# Patient Record
Sex: Male | Born: 1935 | Race: White | Hispanic: No | Marital: Married | Smoking: Former smoker
Health system: Southern US, Community
[De-identification: ages and names within clinical notes are randomized; demographics above are authoritative.]

## PROBLEM LIST (undated history)

## (undated) DIAGNOSIS — K4090 Unilateral inguinal hernia, without obstruction or gangrene, not specified as recurrent: Secondary | ICD-10-CM

## (undated) DIAGNOSIS — I219 Acute myocardial infarction, unspecified: Secondary | ICD-10-CM

## (undated) DIAGNOSIS — N183 Chronic kidney disease, stage 3 unspecified: Secondary | ICD-10-CM

## (undated) DIAGNOSIS — I10 Essential (primary) hypertension: Secondary | ICD-10-CM

## (undated) DIAGNOSIS — I509 Heart failure, unspecified: Secondary | ICD-10-CM

## (undated) DIAGNOSIS — E785 Hyperlipidemia, unspecified: Secondary | ICD-10-CM

## (undated) DIAGNOSIS — J189 Pneumonia, unspecified organism: Secondary | ICD-10-CM

## (undated) HISTORY — DX: Hyperlipidemia, unspecified: E78.5

## (undated) HISTORY — DX: Acute myocardial infarction, unspecified: I21.9

## (undated) HISTORY — DX: Chronic kidney disease, stage 3 unspecified: N18.30

## (undated) HISTORY — DX: Heart failure, unspecified: I50.9

## (undated) HISTORY — PX: CHOLECYSTECTOMY: SHX55

## (undated) HISTORY — PX: PROSTATE ABLATION: SHX6042

## (undated) HISTORY — DX: Chronic kidney disease, stage 3 (moderate): N18.3

## (undated) HISTORY — DX: Essential (primary) hypertension: I10

---

## 2013-02-21 HISTORY — PX: CARDIAC CATHETERIZATION: SHX172

## 2013-02-24 ENCOUNTER — Ambulatory Visit: Payer: Self-pay | Admitting: Internal Medicine

## 2013-02-24 ENCOUNTER — Inpatient Hospital Stay: Payer: Self-pay | Admitting: Internal Medicine

## 2013-02-24 DIAGNOSIS — I214 Non-ST elevation (NSTEMI) myocardial infarction: Secondary | ICD-10-CM

## 2013-02-24 LAB — CBC WITH DIFFERENTIAL/PLATELET
Basophil #: 0.1 10*3/uL (ref 0.0–0.1)
Basophil #: 0 10*3/uL (ref 0.0–0.1)
Basophil %: 0.7 %
Basophil %: 0.3 %
Eosinophil #: 0 10*3/uL (ref 0.0–0.7)
Eosinophil #: 0 10*3/uL (ref 0.0–0.7)
Eosinophil %: 0.1 %
HCT: 52.1 % — ABNORMAL HIGH (ref 40.0–52.0)
HCT: 48.5 % (ref 40.0–52.0)
HGB: 16.1 g/dL (ref 13.0–18.0)
Lymphocyte %: 18 %
MCH: 32.5 pg (ref 26.0–34.0)
MCH: 32.3 pg (ref 26.0–34.0)
MCHC: 33.2 g/dL (ref 32.0–36.0)
MCHC: 33.2 g/dL (ref 32.0–36.0)
MCV: 98 fL (ref 80–100)
MCV: 97 fL (ref 80–100)
Monocyte #: 0.6 x10 3/mm (ref 0.2–1.0)
Monocyte #: 1.1 x10 3/mm — ABNORMAL HIGH (ref 0.2–1.0)
Monocyte %: 6.4 %
Neutrophil #: 7 10*3/uL — ABNORMAL HIGH (ref 1.4–6.5)
Neutrophil #: 8.1 10*3/uL — ABNORMAL HIGH (ref 1.4–6.5)
Neutrophil %: 74.8 %
Platelet: 211 10*3/uL (ref 150–440)
Platelet: 208 10*3/uL (ref 150–440)
RBC: 5.33 10*6/uL (ref 4.40–5.90)
RDW: 13.6 % (ref 11.5–14.5)
WBC: 11.3 10*3/uL — ABNORMAL HIGH (ref 3.8–10.6)

## 2013-02-24 LAB — CK TOTAL AND CKMB (NOT AT ARMC)
CK, Total: 1140 U/L — ABNORMAL HIGH (ref 35–232)
CK, Total: 889 U/L — ABNORMAL HIGH (ref 35–232)
CK-MB: 174 ng/mL — ABNORMAL HIGH (ref 0.5–3.6)
CK-MB: 132.1 ng/mL — ABNORMAL HIGH (ref 0.5–3.6)

## 2013-02-24 LAB — URINALYSIS, COMPLETE
Bacteria: NEGATIVE
Bilirubin,UR: NEGATIVE
Glucose,UR: NEGATIVE mg/dL (ref 0–75)
Leukocyte Esterase: NEGATIVE
Nitrite: NEGATIVE
Ph: 6.5 (ref 4.5–8.0)

## 2013-02-24 LAB — BASIC METABOLIC PANEL
BUN: 15 mg/dL (ref 7–18)
Calcium, Total: 9.1 mg/dL (ref 8.5–10.1)
Chloride: 103 mmol/L (ref 98–107)
Co2: 28 mmol/L (ref 21–32)
EGFR (Non-African Amer.): 47 — ABNORMAL LOW
Osmolality: 274 (ref 275–301)
Sodium: 136 mmol/L (ref 136–145)

## 2013-02-24 LAB — COMPREHENSIVE METABOLIC PANEL
Alkaline Phosphatase: 112 U/L
Anion Gap: 10 (ref 7–16)
BUN: 14 mg/dL (ref 7–18)
Calcium, Total: 9.5 mg/dL (ref 8.5–10.1)
Chloride: 99 mmol/L (ref 98–107)
Co2: 27 mmol/L (ref 21–32)
EGFR (African American): 52 — ABNORMAL LOW
Glucose: 147 mg/dL — ABNORMAL HIGH (ref 65–99)
Osmolality: 275 (ref 275–301)
Sodium: 136 mmol/L (ref 136–145)

## 2013-02-24 LAB — TROPONIN I
Troponin-I: 27 ng/mL — ABNORMAL HIGH
Troponin-I: >40 ng/mL

## 2013-02-24 LAB — CK: CK, Total: 1092 U/L — ABNORMAL HIGH (ref 35–232)

## 2013-02-25 ENCOUNTER — Encounter: Payer: Self-pay | Admitting: Cardiovascular Disease

## 2013-02-25 DIAGNOSIS — I2510 Atherosclerotic heart disease of native coronary artery without angina pectoris: Secondary | ICD-10-CM

## 2013-02-25 DIAGNOSIS — I517 Cardiomegaly: Secondary | ICD-10-CM

## 2013-02-25 LAB — CBC WITH DIFFERENTIAL/PLATELET
Basophil #: 0 10*3/uL (ref 0.0–0.1)
Basophil %: 0.2 %
HCT: 44 % (ref 40.0–52.0)
HGB: 15.1 g/dL (ref 13.0–18.0)
Lymphocyte #: 1.9 10*3/uL (ref 1.0–3.6)
Lymphocyte %: 18.3 %
Monocyte #: 1.1 x10 3/mm — ABNORMAL HIGH (ref 0.2–1.0)
Neutrophil #: 7.2 10*3/uL — ABNORMAL HIGH (ref 1.4–6.5)
Neutrophil %: 70.9 %
RBC: 4.55 10*6/uL (ref 4.40–5.90)
RDW: 13.2 % (ref 11.5–14.5)
WBC: 10.2 10*3/uL (ref 3.8–10.6)

## 2013-02-25 LAB — CK TOTAL AND CKMB (NOT AT ARMC): CK-MB: 82.3 ng/mL — ABNORMAL HIGH (ref 0.5–3.6)

## 2013-02-25 LAB — BASIC METABOLIC PANEL
BUN: 16 mg/dL (ref 7–18)
Co2: 26 mmol/L (ref 21–32)
EGFR (African American): 55 — ABNORMAL LOW
Osmolality: 273 (ref 275–301)

## 2013-02-25 LAB — LIPID PANEL
HDL Cholesterol: 40 mg/dL (ref 40–60)
Ldl Cholesterol, Calc: 71 mg/dL (ref 0–100)
VLDL Cholesterol, Calc: 27 mg/dL (ref 5–40)

## 2013-02-25 LAB — TROPONIN I: Troponin-I: 39 ng/mL — ABNORMAL HIGH

## 2013-02-26 LAB — BASIC METABOLIC PANEL
Calcium, Total: 8.2 mg/dL — ABNORMAL LOW (ref 8.5–10.1)
Chloride: 103 mmol/L (ref 98–107)
Co2: 27 mmol/L (ref 21–32)
Creatinine: 1.46 mg/dL — ABNORMAL HIGH (ref 0.60–1.30)
EGFR (African American): 50 — ABNORMAL LOW
EGFR (Non-African Amer.): 44 — ABNORMAL LOW
Potassium: 3.7 mmol/L (ref 3.5–5.1)

## 2013-02-27 LAB — BASIC METABOLIC PANEL
Anion Gap: 7 (ref 7–16)
BUN: 28 mg/dL — ABNORMAL HIGH (ref 7–18)
Calcium, Total: 8.1 mg/dL — ABNORMAL LOW (ref 8.5–10.1)
Chloride: 102 mmol/L (ref 98–107)
Co2: 24 mmol/L (ref 21–32)
Creatinine: 1.54 mg/dL — ABNORMAL HIGH (ref 0.60–1.30)
EGFR (African American): 47 — ABNORMAL LOW
Sodium: 133 mmol/L — ABNORMAL LOW (ref 136–145)

## 2013-02-28 ENCOUNTER — Telehealth: Payer: Self-pay | Admitting: *Deleted

## 2013-02-28 ENCOUNTER — Encounter: Payer: Self-pay | Admitting: *Deleted

## 2013-02-28 NOTE — Telephone Encounter (Signed)
Patient contacted regarding discharge from [facility]  on 02/27/13  Patient understands to follow up with provider Nahser on 03/01/13 at 1345 at [facility]. Patient understands discharge instructions? Yes  Patient understands medications and regiment? Yes  Patient understands to bring all medications to this visit?yes

## 2013-03-01 ENCOUNTER — Ambulatory Visit (INDEPENDENT_AMBULATORY_CARE_PROVIDER_SITE_OTHER): Payer: Medicare Other | Admitting: Cardiovascular Disease

## 2013-03-01 ENCOUNTER — Encounter: Payer: Self-pay | Admitting: Cardiovascular Disease

## 2013-03-01 ENCOUNTER — Ambulatory Visit (INDEPENDENT_AMBULATORY_CARE_PROVIDER_SITE_OTHER): Payer: Medicare Other

## 2013-03-01 ENCOUNTER — Encounter: Payer: Self-pay | Admitting: *Deleted

## 2013-03-01 VITALS — BP 114/74 | HR 68 | Ht 72.0 in | Wt 208.0 lb

## 2013-03-01 DIAGNOSIS — N182 Chronic kidney disease, stage 2 (mild): Secondary | ICD-10-CM

## 2013-03-01 DIAGNOSIS — I2510 Atherosclerotic heart disease of native coronary artery without angina pectoris: Secondary | ICD-10-CM

## 2013-03-01 DIAGNOSIS — E785 Hyperlipidemia, unspecified: Secondary | ICD-10-CM

## 2013-03-01 DIAGNOSIS — I509 Heart failure, unspecified: Secondary | ICD-10-CM

## 2013-03-01 DIAGNOSIS — N2889 Other specified disorders of kidney and ureter: Secondary | ICD-10-CM

## 2013-03-01 DIAGNOSIS — I5022 Chronic systolic (congestive) heart failure: Secondary | ICD-10-CM

## 2013-03-01 DIAGNOSIS — I10 Essential (primary) hypertension: Secondary | ICD-10-CM

## 2013-03-01 NOTE — Assessment & Plan Note (Signed)
He's doing well from a cardiac standpoint. He's not having any episodes of angina. The decision was made to treat him medically. He has an occluded second diagonal artery with intact collateral filling.  He's done well. He's not had any episodes of angina. We'll continue with the same medications. We'll check his fasting lipids at his next office visit. We'll continue therapy for his congestive heart failure.

## 2013-03-01 NOTE — Patient Instructions (Addendum)
Your physician recommends that you schedule a follow-up appointment in: 3 months. Your physician recommends that you return for fasting lab work in: 3 months:  Liver profile & lipid profile  We will draw labs today: BMP

## 2013-03-01 NOTE — Assessment & Plan Note (Addendum)
He's not had any significant episodes of chest pain or shortness breath. He does have some rales in his right base that I think are due to atelectasis. He does not seem to have any signs or symptoms of CHF. We'll continue with his same medications. Will consider repeating an echocardiogram sometime later next year.

## 2013-03-01 NOTE — Progress Notes (Signed)
Samuel Mcbride Date of Birth  08-Aug-1925       [facility] Office [address]  [address] [place], [place]  [address]   [place], [place]  [address] [phone]     [phone]   Fax  [phone]    Fax [phone]  Problem List: 1. Coronary artery disease-status post non-ST segment elevation myocardial infarction due to occlusion of a large second diagonal branch. Medical management was recommended 2. Hyperlipidemia 3. Hypertension 4. Chronic kidney disease-stage II 5. Chronic systolic congestive heart failure-ejection fraction of 30-35% by echo.  History of Present Illness:  Samuel Mcbride is an 79 year old gentleman who presented to [facility] with episodes of chest pain. He was found to have a non-ST segment elevation myocardial infarction. He was taken to the cath lab the following day and was found to have an occlusion of the second diagonal vessel.   He had  intact collateral filling of the diagonal vessel. It was thought that he should be treated medically.    He's done well since that time. He's not had any episodes of chest pain or shortness of breath.   Echocardiogram found acute on chronic systolic congestive heart failure with an ejection fraction of 30-35%.  His diet is good. His appetite is good. He's eating low-fat and low-salt choices.    He's not had any further episodes of chest discomfort.  Current Outpatient Prescriptions on File Prior to Visit  Medication Sig Dispense Refill  . aspirin 81 MG tablet Take 81 mg by mouth daily.      . carvedilol (COREG) 6.25 MG tablet Take 6.25 mg by mouth 2 (two) times daily with a meal.      . clopidogrel (PLAVIX) 75 MG tablet Take 75 mg by mouth daily with breakfast.      . furosemide (LASIX) 20 MG tablet Take 20 mg by mouth.      . nitroGLYCERIN (NITROSTAT) 0.4 MG SL tablet Place 0.4 mg under the tongue every 5 (five) minutes as needed for chest pain.      .  simvastatin (ZOCOR) 20 MG tablet Take 20 mg by mouth daily.       No current facility-administered medications on file prior to visit.    Allergies  Allergen Reactions  . Codeine     dizziness    Past Medical History  Diagnosis Date  . MI (myocardial infarction)   . Hyperlipidemia   . Hypertension   . CHF (congestive heart failure)   . Chronic kidney disease, stage III (moderate)     Past Surgical History  Procedure Laterality Date  . Cardiac catheterization  12/14    [facility]  . Cholecystectomy      History  Smoking status  . Never Smoker   Smokeless tobacco  . Not on file    History  Alcohol Use No    Family History  Problem Relation Age of Onset  . Heart disease Father     Reviw of Systems:  Reviewed in the HPI.  All other systems are negative.  Physical Exam: Blood pressure 114/74, pulse 68, height 6' (1.829 m), weight 208 lb (94.348 kg). General: Well developed, well nourished, in no acute distress.  Head: Normocephalic, atraumatic, sclera non-icteric, mucus membranes are moist,   Neck: Supple. Carotids are 2 + without bruits. No JVD   Lungs: Clear   Heart: RR, normal S1, s2.  Abdomen: Soft, non-tender, non-distended with  normal bowel sounds.  Msk:  Strength and tone are normal   Extremities: No clubbing or cyanosis. No edema.  Distal pedal pulses are 2+ and equal   Neuro: CN II - XII intact.  Alert and oriented X 3.   Psych:  Normal   ECG: 03/01/2013: Normal sinus rhythm at 60 beats a minute. His right bundle branch block. Has no ST or T wave changes.  Assessment / Plan:

## 2013-03-07 ENCOUNTER — Encounter: Payer: Self-pay | Admitting: Cardiovascular Disease

## 2013-03-09 ENCOUNTER — Other Ambulatory Visit: Payer: Self-pay

## 2013-03-09 MED ORDER — CARVEDILOL 6.25 MG PO TABS: 6.2500 mg | ORAL_TABLET | Freq: Two times a day (BID) | ORAL | Status: DC

## 2013-03-09 NOTE — Telephone Encounter (Signed)
This encounter was created in error - please disregard.

## 2013-03-09 NOTE — Telephone Encounter (Signed)
Requested Prescriptions   Signed Prescriptions Disp Refills  . carvedilol (COREG) 6.25 MG tablet 60 tablet 3    Sig: Take 1 tablet (6.25 mg total) by mouth 2 (two) times daily with a meal.    Authorizing Provider: Vesta Mixer    Ordering User: Kendrick Fries

## 2013-03-18 ENCOUNTER — Encounter: Payer: Self-pay | Admitting: *Deleted

## 2013-03-24 DIAGNOSIS — I219 Acute myocardial infarction, unspecified: Secondary | ICD-10-CM

## 2013-03-24 HISTORY — DX: Acute myocardial infarction, unspecified: I21.9

## 2013-03-29 ENCOUNTER — Other Ambulatory Visit: Payer: Self-pay | Admitting: *Deleted

## 2013-03-29 MED ORDER — FUROSEMIDE 20 MG PO TABS: 20.0000 mg | ORAL_TABLET | Freq: Every day | ORAL | Status: DC

## 2013-03-29 MED ORDER — SIMVASTATIN 20 MG PO TABS: 20.0000 mg | ORAL_TABLET | Freq: Every day | ORAL | Status: DC

## 2013-03-29 MED ORDER — CARVEDILOL 6.25 MG PO TABS: 6.2500 mg | ORAL_TABLET | Freq: Two times a day (BID) | ORAL | Status: DC

## 2013-03-29 MED ORDER — CLOPIDOGREL BISULFATE 75 MG PO TABS: 75.0000 mg | ORAL_TABLET | Freq: Every day | ORAL | Status: DC

## 2013-03-31 ENCOUNTER — Telehealth: Payer: Self-pay

## 2013-03-31 NOTE — Telephone Encounter (Signed)
I will refill these meds

## 2013-03-31 NOTE — Telephone Encounter (Signed)
Patty from [facility] called back, informed her Dr Elease Hashimoto would refill pt medications.

## 2013-03-31 NOTE — Telephone Encounter (Signed)
Nurse with Dr Ulanda Edison has gotten several rx requests for medication when pt was in hospital. Nurse is asking if Dr. Elease Hashimoto is going to refill these or should Dr Mayford Knife. Medications are:  Plavix, Lasix and Simvastatin. Please call and advise.

## 2013-04-01 ENCOUNTER — Encounter: Payer: Self-pay | Admitting: Cardiovascular Disease

## 2013-04-01 MED ORDER — CLOPIDOGREL BISULFATE 75 MG PO TABS: 75.0000 mg | ORAL_TABLET | Freq: Every day | ORAL | Status: DC

## 2013-04-01 MED ORDER — ASPIRIN 81 MG PO TABS: 81.0000 mg | ORAL_TABLET | Freq: Every day | ORAL | Status: AC

## 2013-04-01 MED ORDER — CARVEDILOL 6.25 MG PO TABS: 6.2500 mg | ORAL_TABLET | Freq: Two times a day (BID) | ORAL | Status: DC

## 2013-04-01 MED ORDER — FUROSEMIDE 20 MG PO TABS: 20.0000 mg | ORAL_TABLET | Freq: Every day | ORAL | Status: DC

## 2013-04-01 MED ORDER — NITROGLYCERIN 0.4 MG SL SUBL: 0.4000 mg | SUBLINGUAL_TABLET | SUBLINGUAL | Status: DC | PRN

## 2013-04-01 MED ORDER — SIMVASTATIN 20 MG PO TABS: 20.0000 mg | ORAL_TABLET | Freq: Every day | ORAL | Status: DC

## 2013-04-01 NOTE — Telephone Encounter (Signed)
Refills sent to [facility].

## 2013-04-01 NOTE — Telephone Encounter (Signed)
Refill sent to [facility]

## 2013-04-01 NOTE — Addendum Note (Signed)
Addended by: Rhea Belton R on: 04/01/2013 02:39 PM   Modules accepted: Orders

## 2013-06-13 ENCOUNTER — Other Ambulatory Visit: Payer: Medicare Other

## 2013-06-13 ENCOUNTER — Encounter: Payer: Self-pay | Admitting: Cardiovascular Disease

## 2013-06-13 ENCOUNTER — Ambulatory Visit (INDEPENDENT_AMBULATORY_CARE_PROVIDER_SITE_OTHER): Payer: Medicare Other | Admitting: Cardiovascular Disease

## 2013-06-13 VITALS — BP 122/80 | HR 75 | Ht 72.0 in | Wt 202.5 lb

## 2013-06-13 DIAGNOSIS — I5022 Chronic systolic (congestive) heart failure: Secondary | ICD-10-CM

## 2013-06-13 DIAGNOSIS — I2510 Atherosclerotic heart disease of native coronary artery without angina pectoris: Secondary | ICD-10-CM

## 2013-06-13 DIAGNOSIS — I509 Heart failure, unspecified: Secondary | ICD-10-CM

## 2013-06-13 MED ORDER — LISINOPRIL 5 MG PO TABS: 5.0000 mg | ORAL_TABLET | Freq: Every day | ORAL | Status: DC

## 2013-06-13 NOTE — Assessment & Plan Note (Signed)
No angina 

## 2013-06-13 NOTE — Assessment & Plan Note (Signed)
Mr. Rocklin is doing well. He's not having any problems with shortness of breath or chest pain. He is tolerating his medications quite well. He's gained a little bit of weight but this is probably because he is eating a bit too much.  Will add lisinopril 5 mg a day. I'll see him again in 3 months for followup office visit. We will consider getting a repeat echo at that time.  We'll also get a basic profile in 3 months.

## 2013-06-13 NOTE — Patient Instructions (Signed)
Please start lisinopril 5mg  daily  Your physician recommends that you schedule a follow-up appointment in: 3 months. We will do labwork at that time: BMET

## 2013-06-13 NOTE — Progress Notes (Signed)
Samuel Mcbride Date of Birth  [birth year]       [facility] Office [address]  [address] [place], [place]  [address]   [place], [place]  [address] [phone]     [phone]   Fax  [phone]    Fax [phone]  Problem List: 1. Coronary artery disease-status post non-ST segment elevation myocardial infarction due to occlusion of a large second diagonal branch. Medical management was recommended 2. Hyperlipidemia 3. Hypertension 4. Chronic kidney disease-stage II 5. Chronic systolic congestive heart failure-ejection fraction of 30-35% by echo.  History of Present Illness:  Samuel Mcbride is an 80 year old gentleman who presented to [facility] with episodes of chest pain. He was found to have a non-ST segment elevation myocardial infarction. He was taken to the cath lab the following day and was found to have an occlusion of the second diagonal vessel.   He had  intact collateral filling of the diagonal vessel. It was thought that he should be treated medically.    He's done well since that time. He's not had any episodes of chest pain or shortness of breath.   Echocardiogram found acute on chronic systolic congestive heart failure with an ejection fraction of 30-35%.  His diet is good. His appetite is good. He's eating low-fat and low-salt choices.    He's not had any further episodes of chest discomfort.  March 23 ,2015:  Pt. Is doing well.  No dyspnea or CP.    Current Outpatient Prescriptions on File Prior to Visit  Medication Sig Dispense Refill  . aspirin 81 MG tablet Take 1 tablet (81 mg total) by mouth daily.  30 tablet  3  . carvedilol (COREG) 6.25 MG tablet Take 1 tablet (6.25 mg total) by mouth 2 (two) times daily with a meal.  60 tablet  3  . clopidogrel (PLAVIX) 75 MG tablet Take 1 tablet (75 mg total) by mouth daily with breakfast.  30 tablet  6  . furosemide (LASIX) 20 MG tablet Take 1 tablet  (20 mg total) by mouth daily.  30 tablet  6  . nitroGLYCERIN (NITROSTAT) 0.4 MG SL tablet Place 1 tablet (0.4 mg total) under the tongue every 5 (five) minutes as needed for chest pain.  25 tablet  6  . simvastatin (ZOCOR) 20 MG tablet Take 1 tablet (20 mg total) by mouth daily.  30 tablet  6   No current facility-administered medications on file prior to visit.    Allergies  Allergen Reactions  . Codeine     dizziness    Past Medical History  Diagnosis Date  . MI (myocardial infarction)   . Hyperlipidemia   . Hypertension   . CHF (congestive heart failure)   . Chronic kidney disease, stage III (moderate)     Past Surgical History  Procedure Laterality Date  . Cholecystectomy    . Cardiac catheterization  12/14    [facility]    History  Smoking status  . Never Smoker   Smokeless tobacco  . Not on file    History  Alcohol Use No    Family History  Problem Relation Age of Onset  . Heart disease Father     Reviw of Systems:  Reviewed in the HPI.  All other systems are negative.  Physical Exam: Blood pressure 122/80, pulse 75, height 6' (1.829 m), weight 202 lb 8 oz (91.853 kg). General: Well developed, well  nourished, in no acute distress.  Head: Normocephalic, atraumatic, sclera non-icteric, mucus membranes are moist,   Neck: Supple. Carotids are 2 + without bruits. No JVD   Lungs: Clear   Heart: RR, normal S1, s2.  Abdomen: Soft, non-tender, non-distended with normal bowel sounds.  Msk:  Strength and tone are normal   Extremities: No clubbing or cyanosis. No edema.  Distal pedal pulses are 2+ and equal   Neuro: CN II - XII intact.  Alert and oriented X 3.   Psych:  Normal   ECG: 03/01/2013: Normal sinus rhythm at 60 beats a minute. His right bundle branch block. Has no ST or T wave changes.  Assessment / Plan:

## 2013-07-07 ENCOUNTER — Other Ambulatory Visit: Payer: Self-pay | Admitting: Cardiovascular Disease

## 2013-09-02 ENCOUNTER — Encounter: Payer: Self-pay | Admitting: Cardiovascular Disease

## 2013-09-02 ENCOUNTER — Ambulatory Visit (INDEPENDENT_AMBULATORY_CARE_PROVIDER_SITE_OTHER): Payer: Medicare Other | Admitting: Cardiovascular Disease

## 2013-09-02 VITALS — BP 108/78 | HR 71 | Ht 72.0 in | Wt 208.0 lb

## 2013-09-02 DIAGNOSIS — I2510 Atherosclerotic heart disease of native coronary artery without angina pectoris: Secondary | ICD-10-CM

## 2013-09-02 DIAGNOSIS — I509 Heart failure, unspecified: Secondary | ICD-10-CM

## 2013-09-02 DIAGNOSIS — I5022 Chronic systolic (congestive) heart failure: Secondary | ICD-10-CM

## 2013-09-02 MED ORDER — FUROSEMIDE 40 MG PO TABS: 40.0000 mg | ORAL_TABLET | Freq: Every day | ORAL | Status: DC

## 2013-09-02 MED ORDER — LOSARTAN POTASSIUM 50 MG PO TABS: 50.0000 mg | ORAL_TABLET | Freq: Every day | ORAL | Status: DC

## 2013-09-02 NOTE — Assessment & Plan Note (Signed)
BP is well controlled / on the low side today.

## 2013-09-02 NOTE — Progress Notes (Signed)
Samuel Mcbride Date of Birth  [birth year]       [facility] Office [address]  [address] [place], [place]  [address]   [place], [place]  [address] [phone]     [phone]   Fax  [phone]    Fax [phone]  Problem List: 1. Coronary artery disease-status post non-ST segment elevation myocardial infarction due to occlusion of a large second diagonal branch. Medical management was recommended 2. Hyperlipidemia 3. Hypertension 4. Chronic kidney disease-stage II 5. Chronic systolic congestive heart failure-ejection fraction of 30-35% by echo.  History of Present Illness:  Mr. Samuel Mcbride is an 80 year old gentleman who presented to [facility] with episodes of chest pain. He was found to have a non-ST segment elevation myocardial infarction. He was taken to the cath lab the following day and was found to have an occlusion of the second diagonal vessel.   He had  intact collateral filling of the diagonal vessel. It was thought that he should be treated medically.    He's done well since that time. He's not had any episodes of chest pain or shortness of breath.   Echocardiogram found acute on chronic systolic congestive heart failure with an ejection fraction of 30-35%.  His diet is good. His appetite is good. He's eating low-fat and low-salt choices.    He's not had any further episodes of chest discomfort.  March 23 ,2015:  Pt. Is doing well.  No dyspnea or CP.    September 02, 2013:  He has had a cough - probably before the Lisinopril therapy - not worsened with the Lisinopril.  Planted his garden - turnips and tomatoes  Current Outpatient Prescriptions on File Prior to Visit  Medication Sig Dispense Refill  . aspirin 81 MG tablet Take 1 tablet (81 mg total) by mouth daily.  30 tablet  3  . carvedilol (COREG) 6.25 MG tablet Take 1 tablet (6.25 mg total) by mouth 2 (two) times daily with a meal.  60 tablet  3    . clopidogrel (PLAVIX) 75 MG tablet Take 1 tablet (75 mg total) by mouth daily with breakfast.  30 tablet  6  . furosemide (LASIX) 20 MG tablet Take 1 tablet (20 mg total) by mouth daily.  30 tablet  6  . lisinopril (PRINIVIL,ZESTRIL) 5 MG tablet Take 1 tablet (5 mg total) by mouth daily.  90 tablet  3  . Loratadine (CLARITIN) 10 MG CAPS Take 10 mg by mouth daily.      . nitroGLYCERIN (NITROSTAT) 0.4 MG SL tablet Place 1 tablet (0.4 mg total) under the tongue every 5 (five) minutes as needed for chest pain.  25 tablet  6  . simvastatin (ZOCOR) 20 MG tablet Take 1 tablet (20 mg total) by mouth daily.  30 tablet  6   No current facility-administered medications on file prior to visit.    Allergies  Allergen Reactions  . Codeine     dizziness    Past Medical History  Diagnosis Date  . MI (myocardial infarction)   . Hyperlipidemia   . Hypertension   . CHF (congestive heart failure)   . Chronic kidney disease, stage III (moderate)     Past Surgical History  Procedure Laterality Date  . Cholecystectomy    . Cardiac catheterization  12/14    [facility]    History  Smoking status  . Never Smoker   Smokeless tobacco  .  Not on file    History  Alcohol Use No    Family History  Problem Relation Age of Onset  . Heart disease Father     Reviw of Systems:  Reviewed in the HPI.  All other systems are negative.  Physical Exam: Blood pressure 108/78, pulse 71, height 6' (1.829 m), weight 208 lb (94.348 kg). General: Well developed, well nourished, in no acute distress.  Head: Normocephalic, atraumatic, sclera non-icteric, mucus membranes are moist,   Neck: Supple. Carotids are 2 + without bruits. No JVD   Lungs: rales in left base, very slight rales in right base.    Heart: RR, normal S1, s2.  Abdomen: Soft, non-tender, non-distended with normal bowel sounds.  Msk:  Strength and tone are normal   Extremities: No clubbing or cyanosis. No edema.  Distal pedal pulses are 2+  and equal   Neuro: CN II - XII intact.  Alert and oriented X 3.   Psych:  Normal   ECG: September 02, 2013:  NSR at 3371, RBBB  Assessment / Plan:

## 2013-09-02 NOTE — Patient Instructions (Signed)
Medication changes:   Stop Lisinopril                                      Increase Furosemide to 40mg  daily                                      Start:  Losartan 50mg  daily  You will need fasting lab work in 3 weeks:  Bmp, cholesterol, lfts  Your physician recommends that you schedule a follow-up appointment in: 3 months with Dr. Elease Hashimoto  Lab work was done today: bmp     We will call you with your results

## 2013-09-02 NOTE — Assessment & Plan Note (Signed)
Mr. Samuel Mcbride has a hx of CHF.   Has rales on exam today  He has a dry cough ( thinks it may have been there before the lisinopril) . Will DC lisinopril - it may be worsening the cough Start losartan 50 a day Increase lasix to 40 a day BMP today BMP in 3 weeks I'll see him in 3 months - consider echo once we have got his meds stabilized

## 2013-09-02 NOTE — Assessment & Plan Note (Signed)
No angina,  Continue current meds

## 2013-09-03 ENCOUNTER — Ambulatory Visit: Payer: Self-pay | Admitting: Emergency Medicine

## 2013-09-03 LAB — BASIC METABOLIC PANEL
BUN/Creatinine Ratio: 14 (ref 10–22)
BUN: 22 mg/dL (ref 8–27)
CALCIUM: 9.2 mg/dL (ref 8.6–10.2)
CO2: 20 mmol/L (ref 18–29)
CREATININE: 1.6 mg/dL — AB (ref 0.76–1.27)
Chloride: 101 mmol/L (ref 97–108)
GFR calc Af Amer: 45 mL/min/{1.73_m2} — ABNORMAL LOW (ref >59)
GFR calc non Af Amer: 39 mL/min/{1.73_m2} — ABNORMAL LOW (ref >59)
Glucose: 62 mg/dL — ABNORMAL LOW (ref 65–99)
Potassium: 5.4 mmol/L — ABNORMAL HIGH (ref 3.5–5.2)
Sodium: 141 mmol/L (ref 134–144)

## 2013-09-27 ENCOUNTER — Other Ambulatory Visit: Payer: Medicare Other

## 2013-10-04 ENCOUNTER — Ambulatory Visit (INDEPENDENT_AMBULATORY_CARE_PROVIDER_SITE_OTHER): Payer: Medicare Other | Admitting: *Deleted

## 2013-10-04 ENCOUNTER — Other Ambulatory Visit: Payer: Medicare Other

## 2013-10-04 DIAGNOSIS — I10 Essential (primary) hypertension: Secondary | ICD-10-CM

## 2013-10-04 DIAGNOSIS — I2510 Atherosclerotic heart disease of native coronary artery without angina pectoris: Secondary | ICD-10-CM

## 2013-10-04 DIAGNOSIS — E785 Hyperlipidemia, unspecified: Secondary | ICD-10-CM

## 2013-10-04 DIAGNOSIS — I5022 Chronic systolic (congestive) heart failure: Secondary | ICD-10-CM

## 2013-10-04 DIAGNOSIS — N182 Chronic kidney disease, stage 2 (mild): Secondary | ICD-10-CM

## 2013-10-04 DIAGNOSIS — I509 Heart failure, unspecified: Secondary | ICD-10-CM

## 2013-10-05 LAB — HEPATIC FUNCTION PANEL
ALK PHOS: 76 IU/L (ref 39–117)
ALT: 18 IU/L (ref 0–44)
AST: 17 IU/L (ref 0–40)
Albumin: 4 g/dL (ref 3.5–4.7)
Bilirubin, Direct: 0.15 mg/dL (ref 0.00–0.40)
Total Bilirubin: 0.5 mg/dL (ref 0.0–1.2)
Total Protein: 6.5 g/dL (ref 6.0–8.5)

## 2013-10-05 LAB — LIPID PANEL
CHOL/HDL RATIO: 3 ratio (ref 0.0–5.0)
Cholesterol, Total: 130 mg/dL (ref 100–199)
HDL: 44 mg/dL (ref >39)
LDL Calculated: 48 mg/dL (ref 0–99)
Triglycerides: 191 mg/dL — ABNORMAL HIGH (ref 0–149)
VLDL CHOLESTEROL CAL: 38 mg/dL (ref 5–40)

## 2013-10-05 LAB — BASIC METABOLIC PANEL
BUN/Creatinine Ratio: 15 (ref 10–22)
BUN: 23 mg/dL (ref 8–27)
CALCIUM: 9.2 mg/dL (ref 8.6–10.2)
CHLORIDE: 102 mmol/L (ref 97–108)
CO2: 23 mmol/L (ref 18–29)
Creatinine, Ser: 1.54 mg/dL — ABNORMAL HIGH (ref 0.76–1.27)
GFR calc Af Amer: 47 mL/min/{1.73_m2} — ABNORMAL LOW (ref >59)
GFR calc non Af Amer: 41 mL/min/{1.73_m2} — ABNORMAL LOW (ref >59)
GLUCOSE: 104 mg/dL — AB (ref 65–99)
Potassium: 4.5 mmol/L (ref 3.5–5.2)
Sodium: 142 mmol/L (ref 134–144)

## 2013-10-18 ENCOUNTER — Other Ambulatory Visit: Payer: Self-pay | Admitting: Cardiovascular Disease

## 2013-10-24 ENCOUNTER — Other Ambulatory Visit: Payer: Self-pay | Admitting: Cardiovascular Disease

## 2013-11-06 ENCOUNTER — Other Ambulatory Visit: Payer: Self-pay | Admitting: Cardiovascular Disease

## 2013-12-01 ENCOUNTER — Other Ambulatory Visit: Payer: Self-pay | Admitting: *Deleted

## 2013-12-01 MED ORDER — SIMVASTATIN 20 MG PO TABS: ORAL_TABLET | ORAL | Status: DC

## 2013-12-01 NOTE — Telephone Encounter (Signed)
Requested Prescriptions   Signed Prescriptions Disp Refills  . simvastatin (ZOCOR) 20 MG tablet 90 tablet 3    Sig: TAKE 1 TABLET (20 MG TOTAL) BY MOUTH DAILY.    Authorizing Provider: Vesta Mixer    Ordering User: Kendrick Fries

## 2013-12-02 ENCOUNTER — Other Ambulatory Visit: Payer: Self-pay

## 2013-12-02 MED ORDER — LOSARTAN POTASSIUM 50 MG PO TABS: 50.0000 mg | ORAL_TABLET | Freq: Every day | ORAL | Status: DC

## 2013-12-02 NOTE — Telephone Encounter (Signed)
Refill sent for losartan. 

## 2013-12-05 ENCOUNTER — Encounter: Payer: Self-pay | Admitting: Cardiovascular Disease

## 2013-12-05 ENCOUNTER — Other Ambulatory Visit: Payer: Self-pay | Admitting: *Deleted

## 2013-12-05 ENCOUNTER — Ambulatory Visit (INDEPENDENT_AMBULATORY_CARE_PROVIDER_SITE_OTHER): Payer: Medicare Other | Admitting: Cardiovascular Disease

## 2013-12-05 VITALS — BP 120/82 | HR 58 | Ht 72.0 in | Wt 208.2 lb

## 2013-12-05 DIAGNOSIS — N182 Chronic kidney disease, stage 2 (mild): Secondary | ICD-10-CM

## 2013-12-05 DIAGNOSIS — I509 Heart failure, unspecified: Secondary | ICD-10-CM

## 2013-12-05 DIAGNOSIS — I10 Essential (primary) hypertension: Secondary | ICD-10-CM

## 2013-12-05 DIAGNOSIS — I2510 Atherosclerotic heart disease of native coronary artery without angina pectoris: Secondary | ICD-10-CM

## 2013-12-05 DIAGNOSIS — I5022 Chronic systolic (congestive) heart failure: Secondary | ICD-10-CM

## 2013-12-05 MED ORDER — CARVEDILOL 6.25 MG PO TABS: ORAL_TABLET | ORAL | Status: DC

## 2013-12-05 MED ORDER — CLOPIDOGREL BISULFATE 75 MG PO TABS: ORAL_TABLET | ORAL | Status: DC

## 2013-12-05 MED ORDER — FUROSEMIDE 40 MG PO TABS: 40.0000 mg | ORAL_TABLET | Freq: Every day | ORAL | Status: DC

## 2013-12-05 NOTE — Progress Notes (Signed)
Samuel Mcbride Date of Birth  [birth year]       [facility] Office [address]  [address] [place], [place]  [address]   [place], [place]  [address] [phone]     [phone]   Fax  [phone]    Fax [phone]  Problem List: 1. Coronary artery disease-status post non-ST segment elevation myocardial infarction due to occlusion of a large second diagonal branch. Medical management was recommended 2. Hyperlipidemia 3. Hypertension 4. Chronic kidney disease-stage II 5. Chronic systolic congestive heart failure-ejection fraction of 30-35% by echo.  History of Present Illness:  Samuel Mcbride is an 80 year old gentleman who presented to [facility] with episodes of chest pain. He was found to have a non-ST segment elevation myocardial infarction. He was taken to the cath lab the following day and was found to have an occlusion of the second diagonal vessel.   He had  intact collateral filling of the diagonal vessel. It was thought that he should be treated medically.    He's done well since that time. He's not had any episodes of chest pain or shortness of breath.   Echocardiogram found acute on chronic systolic congestive heart failure with an ejection fraction of 30-35%.  His diet is good. His appetite is good. He's eating low-fat and low-salt choices.    He's not had any further episodes of chest discomfort.  March 23 ,2015:  Pt. Is doing well.  No dyspnea or CP.    September 02, 2013:  He has had a cough - probably before the Lisinopril therapy - not worsened with the Lisinopril.  Planted his garden - turnips and tomatoes  Sept. 14, 2015:  Samuel Mcbride is doing much better since we made medicine changes. We discontinued the lisinopril started him on low start. We also added Lasix. His cough has improved.  Breathing is much better.   He also has had his flu shot last month    Current Outpatient Prescriptions  on File Prior to Visit  Medication Sig Dispense Refill  . aspirin 81 MG tablet Take 1 tablet (81 mg total) by mouth daily.  30 tablet  3  . carvedilol (COREG) 6.25 MG tablet TAKE 1 TABLET (6.25 MG TOTAL) BY MOUTH 2 (TWO) TIMES DAILY WITH A MEAL.  60 tablet  3  . clopidogrel (PLAVIX) 75 MG tablet TAKE 1 TABLET (75 MG TOTAL) BY MOUTH DAILY WITH BREAKFAST.  30 tablet  6  . furosemide (LASIX) 40 MG tablet Take 1 tablet (40 mg total) by mouth daily.  30 tablet  6  . Loratadine (CLARITIN) 10 MG CAPS Take 10 mg by mouth daily.      Marland Kitchen losartan (COZAAR) 50 MG tablet Take 1 tablet (50 mg total) by mouth daily.  90 tablet  3  . nitroGLYCERIN (NITROSTAT) 0.4 MG SL tablet Place 1 tablet (0.4 mg total) under the tongue every 5 (five) minutes as needed for chest pain.  25 tablet  6  . simvastatin (ZOCOR) 20 MG tablet Take 1 tablet (20 mg total) by mouth daily.  30 tablet  6   No current facility-administered medications on file prior to visit.    Allergies  Allergen Reactions  . Codeine     dizziness    Past Medical History  Diagnosis Date  . MI (myocardial infarction)   . Hyperlipidemia   . Hypertension   . CHF (congestive heart failure)   .  Chronic kidney disease, stage III (moderate)     Past Surgical History  Procedure Laterality Date  . Cholecystectomy    . Cardiac catheterization  12/14    [facility]    History  Smoking status  . Never Smoker   Smokeless tobacco  . Not on file    History  Alcohol Use No    Family History  Problem Relation Age of Onset  . Heart disease Father     Reviw of Systems:  Reviewed in the HPI.  All other systems are negative.  Physical Exam: Blood pressure 120/82, pulse 58, height 6' (1.829 m), weight 208 lb 4 oz (94.462 kg). General: Well developed, well nourished, in no acute distress.  Head: Normocephalic, atraumatic, sclera non-icteric, mucus membranes are moist,   Neck: Supple. Carotids are 2 + without bruits. No JVD   Lungs: rales in left  base, very slight rales in right base.    Heart: RR, normal S1, s2.  Abdomen: Soft, non-tender, non-distended with normal bowel sounds.  Msk:  Strength and tone are normal   Extremities: No clubbing or cyanosis. No edema.  Distal pedal pulses are 2+ and equal   Neuro: CN II - XII intact.  Alert and oriented X 3.   Psych:  Normal   ECG:   Assessment / Plan:

## 2013-12-05 NOTE — Assessment & Plan Note (Signed)
His creatinine remains stable.

## 2013-12-05 NOTE — Patient Instructions (Signed)
Your physician wants you to follow-up in: 6 months. You will receive a reminder letter in the mail two months in advance. If you don't receive a letter, please call our office to schedule the follow-up appointment.  Your physician recommends that you continue on your current medications as directed. Please refer to the Current Medication list given to you today.  Your physician recommends that you return for lab work in:  At your 6 month appointment

## 2013-12-05 NOTE — Assessment & Plan Note (Signed)
His blood pressure is much better. Continue same medications.

## 2013-12-05 NOTE — Assessment & Plan Note (Signed)
His breathing quite a bit better. Continue with same medications. He's tolerating the low start better than he did the lisinopril. I've encouraged him to continue to avoid eating any extra salt.

## 2013-12-05 NOTE — Telephone Encounter (Signed)
Requested Prescriptions   Signed Prescriptions Disp Refills  . carvedilol (COREG) 6.25 MG tablet 180 tablet 3    Sig: TAKE 1 TABLET (6.25 MG TOTAL) BY MOUTH 2 (TWO) TIMES DAILY WITH A MEAL.    Authorizing Provider: Vesta Mixer    Ordering User: Shawnie Dapper, MARINA C  . clopidogrel (PLAVIX) 75 MG tablet 90 tablet 3    Sig: TAKE 1 TABLET (75 MG TOTAL) BY MOUTH DAILY WITH BREAKFAST.    Authorizing Provider: Vesta Mixer    Ordering User: Shawnie Dapper, MARINA C  . furosemide (LASIX) 40 MG tablet 90 tablet 3    Sig: Take 1 tablet (40 mg total) by mouth daily.    Authorizing Provider: Vesta Mixer    Ordering User: Kendrick Fries

## 2013-12-22 ENCOUNTER — Telehealth: Payer: Self-pay | Admitting: Cardiovascular Disease

## 2013-12-22 NOTE — Telephone Encounter (Signed)
Pt wife is calling about lab work, for PCP called them about their blood work they did recently and they are now referring them to [facility], for pt is stage 3 in something.

## 2013-12-26 NOTE — Telephone Encounter (Signed)
Reviewed previous BUN and creatinine with patient and wife

## 2014-01-26 ENCOUNTER — Telehealth: Payer: Self-pay

## 2014-01-26 NOTE — Telephone Encounter (Signed)
Wife called states the kidney specialist told him today to cut back on his Furosemide 40mg  to 20 mg. Please advise if he should do this. Pt wife states she will be available until 12:30 and will be gone for a couple of hours.

## 2014-01-27 NOTE — Telephone Encounter (Signed)
Informed patient wife of Dr. Namon Cirri recommendation Patients wife verbalized understanding

## 2014-01-27 NOTE — Telephone Encounter (Signed)
He has significant renal insufficiency and I agree with decreasing lasix if that is what nephrology would like to try. I do not have the most recent BMP.

## 2014-06-15 ENCOUNTER — Encounter: Payer: Self-pay | Admitting: Cardiovascular Disease

## 2014-06-15 ENCOUNTER — Ambulatory Visit (INDEPENDENT_AMBULATORY_CARE_PROVIDER_SITE_OTHER): Payer: Medicare Other | Admitting: Cardiovascular Disease

## 2014-06-15 VITALS — BP 110/82 | HR 60 | Ht 72.0 in | Wt 208.5 lb

## 2014-06-15 DIAGNOSIS — I10 Essential (primary) hypertension: Secondary | ICD-10-CM

## 2014-06-15 DIAGNOSIS — I5022 Chronic systolic (congestive) heart failure: Secondary | ICD-10-CM | POA: Diagnosis not present

## 2014-06-15 DIAGNOSIS — I2510 Atherosclerotic heart disease of native coronary artery without angina pectoris: Secondary | ICD-10-CM | POA: Diagnosis not present

## 2014-06-15 DIAGNOSIS — E785 Hyperlipidemia, unspecified: Secondary | ICD-10-CM | POA: Diagnosis not present

## 2014-06-15 NOTE — Patient Instructions (Signed)
Your physician recommends that you have labs today: BMP  Lipid and Liver    Your physician has requested that you have an echocardiogram next week. Echocardiography is a painless test that uses sound waves to create images of your heart. It provides your doctor with information about the size and shape of your heart and how well your heart's chambers and valves are working. This procedure takes approximately one hour. There are no restrictions for this procedure.   Your physician wants you to follow-up in: 6 months. You will receive a reminder letter in the mail two months in advance. If you don't receive a letter, please call our office to schedule the follow-up appointment.

## 2014-06-15 NOTE — Progress Notes (Signed)
Cardiology Office Note   Date:  06/15/2014   ID:  Samuel Mcbride, Samuel Mcbride [birth year], MRN [id number]  PCP:  [facility], [place]  Cardiologist:   Nahser, Deloris Ping, MD   Chief Complaint  Patient presents with  . other    6 month f/u no complaints. Meds reviewed verbally with pt.   1. Coronary artery disease-status post non-ST segment elevation myocardial infarction due to occlusion of a large second diagonal branch. Medical management was recommended 2. Hyperlipidemia 3. Hypertension 4. Chronic kidney disease-stage II 5. Chronic systolic congestive heart failure-ejection fraction of 30-35% by echo.  History of Present Illness:  Samuel Mcbride is an 81 year old gentleman who presented to [facility] with episodes of chest pain. He was found to have a non-ST segment elevation myocardial infarction. He was taken to the cath lab the following day and was found to have an occlusion of the second diagonal vessel. He had intact collateral filling of the diagonal vessel. It was thought that he should be treated medically.   He's done well since that time. He's not had any episodes of chest pain or shortness of breath. Echocardiogram found acute on chronic systolic congestive heart failure with an ejection fraction of 30-35%. His diet is good. His appetite is good. He's eating low-fat and low-salt choices. He's not had any further episodes of chest discomfort.  March 23 ,2015:  Pt. Is doing well. No dyspnea or CP.   September 02, 2013: He has had a cough - probably before the Lisinopril therapy - not worsened with the Lisinopril. Planted his garden - turnips and tomatoes  Sept. 14, 2015:  Samuel Mcbride is doing much better since we made medicine changes. We discontinued the lisinopril started him on low start. We also added Lasix. His cough has improved. Breathing is much better.  He also has had his flu shot last month   June 15, 2014:  Samuel Mcbride is a  81 y.o. male who presents for follow-up of his congestive heart failure. No dyspnea,  No CP. No orthopnea or    Past Medical History  Diagnosis Date  . MI (myocardial infarction)   . Hyperlipidemia   . Hypertension   . CHF (congestive heart failure)   . Chronic kidney disease, stage III (moderate)     Past Surgical History  Procedure Laterality Date  . Cholecystectomy    . Cardiac catheterization  12/14    [facility]     Current Outpatient Prescriptions  Medication Sig Dispense Refill  . aspirin 81 MG tablet Take 1 tablet (81 mg total) by mouth daily. 30 tablet 3  . carvedilol (COREG) 6.25 MG tablet TAKE 1 TABLET (6.25 MG TOTAL) BY MOUTH 2 (TWO) TIMES DAILY WITH A MEAL. 180 tablet 3  . cetirizine (ZYRTEC) 10 MG tablet Take 10 mg by mouth daily.    . clopidogrel (PLAVIX) 75 MG tablet TAKE 1 TABLET (75 MG TOTAL) BY MOUTH DAILY WITH BREAKFAST. 90 tablet 3  . fluticasone (FLONASE) 50 MCG/ACT nasal spray Place 1 spray into both nostrils as needed for allergies or rhinitis.    . furosemide (LASIX) 20 MG tablet Take 20 mg by mouth daily.    Marland Kitchen lisinopril (PRINIVIL,ZESTRIL) 5 MG tablet Take 5 mg by mouth daily.    . Multiple Vitamin (MULTIVITAMIN) tablet Take 1 tablet by mouth daily.    . nitroGLYCERIN (NITROSTAT) 0.4 MG SL tablet Place 1 tablet (0.4 mg total) under the tongue every 5 (five) minutes as needed  for chest pain. 25 tablet 6  . simvastatin (ZOCOR) 20 MG tablet Take 1 tablet (20 mg total) by mouth daily. 30 tablet 6   No current facility-administered medications for this visit.    Allergies:   Codeine    Social History:  The patient  reports that he has never smoked. He does not have any smokeless tobacco history on file. He reports that he does not drink alcohol or use illicit drugs.   Family History:  The patient's family history includes Heart disease in his father.    ROS:  Please see the history of present illness.    Review of Systems: Constitutional:  denies  fever, chills, diaphoresis, appetite change and fatigue.  HEENT: denies photophobia, eye pain, redness, hearing loss, ear pain, congestion, sore throat, rhinorrhea, sneezing, neck pain, neck stiffness and tinnitus.  Respiratory: denies SOB, DOE, cough, chest tightness, and wheezing.  Cardiovascular: denies chest pain, palpitations and leg swelling.  Gastrointestinal: denies nausea, vomiting, abdominal pain, diarrhea, constipation, blood in stool.  Genitourinary: denies dysuria, urgency, frequency, hematuria, flank pain and difficulty urinating.  Musculoskeletal: denies  myalgias, back pain, joint swelling, arthralgias and gait problem.   Skin: denies pallor, rash and wound.  Neurological: denies dizziness, seizures, syncope, weakness, light-headedness, numbness and headaches.   Hematological: denies adenopathy, easy bruising, personal or family bleeding history.  Psychiatric/ Behavioral: denies suicidal ideation, mood changes, confusion, nervousness, sleep disturbance and agitation.       All other systems are reviewed and negative.    PHYSICAL EXAM: VS:  BP 110/82 mmHg  Pulse 60  Ht 6' (1.829 m)  Wt 208 lb 8 oz (94.575 kg)  BMI 28.27 kg/m2 , BMI Body mass index is 28.27 kg/(m^2). GEN: Well nourished, well developed, in no acute distress HEENT: normal Neck: no JVD, carotid bruits, or masses Cardiac: RRR; no murmurs, rubs, or gallops,no edema  Respiratory:  clear to auscultation bilaterally, normal work of breathing GI: soft, nontender, nondistended, + BS MS: no deformity or atrophy Skin: warm and dry, no rash Neuro:  Strength and sensation are intact Psych: normal   EKG:  EKG is ordered today. The ekg ordered today demonstrates normal sinus rhythm at 60. 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   Recent Labs: 10/04/2013: ALT 18; BUN 23; Creatinine 1.54*; Potassium 4.5; Sodium 142    Lipid Panel    Component Value Date/Time   CHOL 130  10/04/2013 0758   TRIG 191* 10/04/2013 0758   HDL 44 10/04/2013 0758   CHOLHDL 3.0 10/04/2013 0758   LDLCALC 48 10/04/2013 0758      Wt Readings from Last 3 Encounters:  06/15/14 208 lb 8 oz (94.575 kg)  12/05/13 208 lb 4 oz (94.462 kg)  09/02/13 208 lb (94.348 kg)      Other studies Reviewed: Additional studies/ records that were reviewed today include: labs from [facility] Review of the above records demonstrates: normal electrolytes.    ASSESSMENT AND PLAN:  1. Coronary artery disease-status post non-ST segment elevation myocardial infarction due to occlusion of a large second diagonal branch. Medical management was recommended. He's not having any angina. We'll check his fasting lipids today.  2. Hyperlipidemia - we'll check fasting this today. Continue current medications. 3. Hypertension 4. Chronic kidney disease-stage II 5. Chronic systolic congestive heart failure-ejection fraction of 30-35% by echo. - He's doing very well. Continue current dose of carvedilol and lisinopril. We will recheck his echocardiogram next week.  If his ejection fraction is still less than 35% we will need to consider ICD placement.   He's completely asymptomatic. We'll have to also consider that he's 81 years old.   Current medicines are reviewed at length with the patient today.  The patient does not have concerns regarding medicines.  The following changes have been made:  no change  Labs/ tests ordered today include: Orders Placed This Encounter  Procedures  . EKG 12-Lead     Disposition:   FU with me in 6 months    Signed, Nahser, Deloris Ping, MD  06/15/2014 8:53 AM    [facility] [address] [place], [place], [place]  [address] Phone: [phone]; Fax: [phone]

## 2014-06-16 LAB — BASIC METABOLIC PANEL
BUN/Creatinine Ratio: 13 (ref 10–22)
BUN: 20 mg/dL (ref 8–27)
CALCIUM: 8.8 mg/dL (ref 8.6–10.2)
CO2: 23 mmol/L (ref 18–29)
CREATININE: 1.58 mg/dL — AB (ref 0.76–1.27)
Chloride: 102 mmol/L (ref 97–108)
GFR calc Af Amer: 45 mL/min/{1.73_m2} — ABNORMAL LOW (ref >59)
GFR, EST NON AFRICAN AMERICAN: 39 mL/min/{1.73_m2} — AB (ref >59)
GLUCOSE: 87 mg/dL (ref 65–99)
Potassium: 3.9 mmol/L (ref 3.5–5.2)
Sodium: 140 mmol/L (ref 134–144)

## 2014-06-16 LAB — HEPATIC FUNCTION PANEL
ALBUMIN: 3.7 g/dL (ref 3.5–4.7)
ALT: 16 IU/L (ref 0–44)
AST: 15 IU/L (ref 0–40)
Alkaline Phosphatase: 76 IU/L (ref 39–117)
BILIRUBIN TOTAL: 0.3 mg/dL (ref 0.0–1.2)
Bilirubin, Direct: 0.12 mg/dL (ref 0.00–0.40)
TOTAL PROTEIN: 5.9 g/dL — AB (ref 6.0–8.5)

## 2014-06-16 LAB — LIPID PANEL
CHOL/HDL RATIO: 2.9 ratio (ref 0.0–5.0)
Cholesterol, Total: 124 mg/dL (ref 100–199)
HDL: 43 mg/dL (ref >39)
LDL Calculated: 53 mg/dL (ref 0–99)
TRIGLYCERIDES: 142 mg/dL (ref 0–149)
VLDL Cholesterol Cal: 28 mg/dL (ref 5–40)

## 2014-07-06 ENCOUNTER — Other Ambulatory Visit: Payer: Self-pay

## 2014-07-06 ENCOUNTER — Telehealth: Payer: Self-pay | Admitting: Cardiovascular Disease

## 2014-07-06 ENCOUNTER — Other Ambulatory Visit (INDEPENDENT_AMBULATORY_CARE_PROVIDER_SITE_OTHER): Payer: Medicare Other

## 2014-07-06 DIAGNOSIS — I252 Old myocardial infarction: Secondary | ICD-10-CM

## 2014-07-06 DIAGNOSIS — I5022 Chronic systolic (congestive) heart failure: Secondary | ICD-10-CM

## 2014-07-06 NOTE — Telephone Encounter (Signed)
Patient would like to be called with echo results from today when they are available.  Patient aware that this may take several days to read and for nurse/md to contact.

## 2014-07-14 NOTE — Consult Note (Signed)
General Aspect 81 year old male who was seen in [facility] and then sent to our hospital for a direct admission with chest pain. The patient actually has a non-ST elevation MI. EKG there revealed a right bundle branch block and elevated troponin of 27. He presented to have [facility] with chest pain. The patient is not a very good historian; however, he said last night, he had chest pain. He was unable to sleep due to the chest pain, which was located on left side. It does radiate to his right jaw but not to his arm. He had nausea associated with it, but no shortness of breath. He has not had chest pain before. At Urgent Care he had a troponin which was 27.   Present Illness . PAST MEDICAL HISTORY:  None.   MEDICATIONS:  Aspirin 81 mg daily.   SOCIAL HISTORY:  No tobacco, alcohol or IV drug use.   FAMILY HISTORY:  father with cad  SURGICAL HISTORY:  None.   Physical Exam:  GEN well developed, well nourished, no acute distress   HEENT hearing intact to voice, moist oral mucosa   NECK supple   RESP normal resp effort  clear BS   CARD Regular rate and rhythm  No murmur   ABD denies tenderness  soft   LYMPH negative neck   EXTR negative edema   SKIN normal to palpation   NEURO motor/sensory function intact   PSYCH alert, A+O to time, place, person, good insight   Review of Systems:  Subjective/Chief Complaint fatigue, GI upset   General: No Complaints   Skin: No Complaints   ENT: No Complaints   Eyes: No Complaints   Neck: No Complaints   Respiratory: No Complaints   Cardiovascular: Chest pain or discomfort   Gastrointestinal: Nausea   Genitourinary: No Complaints   Vascular: No Complaints   Musculoskeletal: No Complaints   Neurologic: No Complaints   Hematologic: No Complaints   Endocrine: No Complaints   Psychiatric: No Complaints   Review of Systems: All other systems were reviewed and found to be negative    Medications/Allergies Reviewed Medications/Allergies reviewed     cholecystectomy:    BPH, s/p prostate surgery:    Other, see comments: prostrate sx  Lab Results:  Routine Chem:  04-Dec-14 15:35   Result Comment TROPONIN - RESULTS VERIFIED BY REPEAT TESTING.  - C/ JEREMIAH KEENE _0  02/24/13 BY K[facility]  - READ-BACK PROCESS PERFORMED.  Result(s) reported on 24 Feb 2013 at 04:54PM.  Glucose, Serum  128  BUN 15  Creatinine (comp)  1.38  Sodium, Serum 136  Potassium, Serum 4.0  Chloride, Serum 103  CO2, Serum 28  Calcium (Total), Serum 9.1  Anion Gap  5  Osmolality (calc) 274  eGFR (African American)  54  eGFR (Non-African American)  47 (eGFR values <80m/min/1.73 m2 may be an indication of chronic kidney disease (CKD). Calculated eGFR is useful in patients with stable renal function. The eGFR calculation will not be reliable in acutely ill patients when serum creatinine is changing rapidly. It is not useful in  patients on dialysis. The eGFR calculation may not be applicable to patients at the low and high extremes of body sizes, pregnant women, and vegetarians.)  Cardiac:  04-Dec-14 15:35   CK, Total  1140  CPK-MB, Serum  174.0 (Result(s) reported on 24 Feb 2013 at 05:24PM.)  Troponin I  > 40.00 (0.00-0.05 0.05 ng/mL or less: NEGATIVE  Repeat testing in 3-6 hrs  if  clinically indicated. >0.05 ng/mL: POTENTIAL  MYOCARDIAL INJURY. Repeat  testing in 3-6 hrs if  clinically indicated. NOTE: An increase or decrease  of 30% or more on serial  testing suggests a  clinically important change)  Routine Hem:  04-Dec-14 15:35   WBC (CBC)  11.3  RBC (CBC) 4.99  Hemoglobin (CBC) 16.1  Hematocrit (CBC) 48.5  Platelet Count (CBC) 208  MCV 97  MCH 32.3  MCHC 33.2  RDW 13.6  Neutrophil % 71.5  Lymphocyte % 17.9  Monocyte % 10.2  Eosinophil % 0.1  Basophil % 0.3  Neutrophil #  8.1  Lymphocyte # 2.0  Monocyte #  1.1  Eosinophil # 0.0  Basophil # 0.0 (Result(s) reported  on 24 Feb 2013 at 04:06PM.)   EKG:  Interpretation EKG shows nsr with rate 66 bpm, t wave abn in anterolateral leads    Codeine: Alt Ment Status  Vital Signs/Nurse's Notes: **Vital Signs.:   04-Dec-14 15:00  Temperature Temperature (F) 97.6  Celsius 36.4  Temperature Source oral  Pulse Pulse 76  Systolic BP Systolic BP 719  Diastolic BP (mmHg) Diastolic BP (mmHg) 597  Mean BP 129  Pulse Ox % Pulse Ox % 96  Pulse Ox Activity Level  At rest  Oxygen Delivery Room Air/ 21 %  Pulse Ox Heart Rate 38    Impression 81 year old male presenting with chest pain, troponin elevation,  non-ST elevation MI, gi upset.  1) nstemi EKG there reveals T wave inversion in anterolateral leads Elevated troponin of 27, now >40.   Plan is for cardiac cath tomorrow Am on lovenox, b-blocker, asa, statin will hold ace beforte cardiac cath in AM echo pending   Electronic Signatures: Ida Rogue (MD)  (Signed 04-Dec-14 19:17)  Authored: General Aspect/Present Illness, History and Physical Exam, Review of System, Past Medical History, Home Medications, Labs, EKG , Allergies, Vital Signs/Nurse's Notes, Impression/Plan   Last Updated: 04-Dec-14 19:17 by Ida Rogue (MD)

## 2014-07-14 NOTE — H&P (Signed)
PATIENT NAME:  Samuel Mcbride, Samuel Mcbride MR#:  [id number] DATE OF BIRTH:  Sep 14, 1925  DATE OF ADMISSION:  02/24/2013  PRIMARY CARE PHYSICIAN:  [facility].   CHIEF COMPLAINT:  Chest pain.   HISTORY OF PRESENT ILLNESS:  This is an 81 year old male who was seen in [facility] and then sent to our hospital for a direct admission with chest pain. The patient actually has a non-ST elevation MI. EKG there revealed a right bundle branch block and elevated troponin of 27. He presented to have [facility] with chest pain. The patient is not a very good historian; however, he said last night, he had chest pain. He was unable to sleep due to the chest pain, which was located on left side. It does radiate to his right jaw but not to his arm. He had nausea associated with it, but no shortness of breath. He has not had chest pain before. At Urgent Care he had a troponin which was 27.  REVIEW OF SYSTEMS: CONSTITUTIONAL:  No fever. Positive fatigue, weakness.  EYES:  No blurred or double vision, glaucoma or cataracts. ENT:  No ear pain, hearing loss, positive snoring. No redness of oropharynx. RESPIRATORY:  No cough, wheezing, hemoptysis, COPD. CARDIOVASCULAR:  Chest pain as mentioned above. No palpitations, orthopnea, syncope, edema, dyspnea on exertion.  GASTROINTESTINAL:  No nausea, vomiting, diarrhea, abdominal pain, melena, or ulcers.  ENDOCRINE:  No polyuria or polydipsia.  HEMATOLOGIC AND LYMPHATICS:  No anemia or easy bleeding.  SKIN:  No rash or lesions.  MUSCULOSKELETAL: No arthritis. [place]:  No history of CVA, TIAs or seizures.  PSYCHIATRIC:  No history of anxiety or depression.   PAST MEDICAL HISTORY:  None.   MEDICATIONS:  Aspirin 81 mg daily.   SOCIAL HISTORY:  No tobacco, alcohol or IV drug use.   FAMILY HISTORY:  Unknown.   SURGICAL HISTORY:  None.  PHYSICAL EXAMINATION:  VITAL SIGNS:  Temperature is 98.6, pulse 78, blood pressure 165/108, 97% on room air.  GENERAL:   Alert, oriented, not in acute distress.  HEENT:  Head is atraumatic. Pupils are round and reactive. Sclera anicteric. Mucous membranes are moist.  OROPHARYNX:  Clear.  NECK:  Supple without JVD, carotid bruit or enlarged thyroid.  CARDIOVASCULAR:  Regular rate and rhythm. No murmurs, gallops, or rubs. PMI is not displaced.  LUNGS:  Clear to auscultation without crackles, rales, rhonchi or wheezing. Normal percussion. Normal chest expansion.  ABDOMEN:  Bowel sounds are positive. Nontender and nondistended. Hard to appreciate for hepatosplenomegaly. No rebound or guarding.  EXTREMITIES:  No clubbing, cyanosis or edema.  NEUROLOGIC:  Cranial nerves II through XII are intact. There are no focal deficits.  SKIN:  Without any rash or lesions.  MUSCULOSKELETAL:  No pathology to digits or nails and all extremities move x 4.   LABORATORY, DIAGNOSTIC AND RADIOLOGICAL DATA:  White blood cells 9.3, hemoglobin 17, hematocrit 52.1, platelets are 211. Sodium 136, potassium 4.2, chloride 99, bicarb 27, BUN 14, creatinine 1.43. Glucose 147, calcium 9.5, bilirubin 0.7. Alk phos 112, ALT 41, AST 151, total protein 7.6, albumin 3.7. Urinalysis shows negative LCE and nitrites. D-dimer is 0.69. CK 1092, CPK-MB 169. Troponin 27.   Chest x-ray shows right lower lobe atelectasis versus scarring, left lower lobe infiltrate of uncertain significance.   AP abdomen:  Bowel gas pattern is normal.   ASSESSMENT AND PLAN:  This is an 81 year old male who presented with chest pain, found to have a non-ST elevation myocardial infarction at  [facility]:  1.  Non-ST elevation myocardial infarction. According to the physician I spoke with at [facility], this is not an ST elevation myocardial infarction. EKG revealed a right bundle branch block. I do not have the EKG from the [facility]. I have ordered an EKG for this hospitalization. The patient will be on Lovenox, cardiology consult, beta blocker, statin,  aspirin and low-dose lisinopril. He will need a cardiac catheterization pending creatinine. I will go ahead and obtain an echocardiogram and cardiology consultation. 2.  Acute kidney injury with a creatinine of 1.43. We will hydrate the patient and repeat a BMP in the morning.  3.  Malignant hypertension. We will treat with metoprolol, lisinopril, nitroglycerin and monitor blood pressure closely.  4.  Elevated AST secondary to non-ST elevation myocardial infarction.   CODE STATUS:  The patient is a full CODE STATUS.  CRITICAL CARE TIME SPENT:  Approximately 55 minutes   ____________________________ Rubert Frediani P. Benjie Karvonen, MD spm:jm D: 02/24/2013 15:28:03 ET T: 02/24/2013 16:47:27 ET JOB#: [id number]  cc: Briahnna Harries P. Benjie Karvonen, MD, <Dictator> [facility] P Yesmin Mutch MD ELECTRONICALLY SIGNED 02/24/2013 18:35

## 2014-07-14 NOTE — Discharge Summary (Signed)
PATIENT NAME:  Samuel Mcbride, Samuel Mcbride MR#:  [id number] DATE OF BIRTH:  05-Jun-1925  DATE OF ADMISSION:  02/24/2013  DATE OF DISCHARGE:  02/27/2013  ADMITTING DIAGNOSIS: Chest pain.  DISCHARGE DIAGNOSES:   1. Chest pain due to non-ST MI, status post cardiac catheterization, with severe one-vessel coronary artery disease, with occluded large second diagonal branch,  with faint left-to-right collaterals. Medical management was recommended by Cardiology.   2. Systolic dysfunction, with possible acute systolic congestive heart failure, treated with beta blocker and Lasix. ACE inhibitor not used due to patient's creatinine being elevated. This will be restarted as an outpatient.   3.  Malignant hypertension, now improved with current treatment.   4. Likely chronic kidney disease stage III. The patient's renal function will be followed as an outpatient. Recommend  Nephrology evaluation as outpatient as well.   5.  Hyperlipidemia.   CONSULTANTS:  Dr. Kirke Corin  PROCEDURES:  Cardiac catheterization. Cath showed one-vessel coronary artery disease, with an occluded large second diagonal branch of the ostium, faint left-to-left collaterals. Left main:   There was 30% stenosis. LAD was normal size. First diagonal was normal size. Second diagonal vessel was a large size. There was 100% stenosis. Normal circumflex.   DATA:  Admitting glucose 147, BUN 14, creatinine 1.43, sodium 136, potassium 4.2, chloride 99, CO2 is 27. Fasting lipid panel: Total cholesterol 138, triglycerides 134, HDL was 40, LDL was 71. LFTs were normal, except AST of 151. Troponin was 27, subsequently greater than 40. CK-MB was 169, then 174, then 132. WBC 9.3, hemoglobin 17.3, platelet count 211. Urine cultures: Mixed growth. EKG showed normal sinus rhythm with right bundle branch block. Echocardiogram of the heart showed EF 30% to 35%, moderate to severely decreased global left ventricular systolic function, mildly dilated left atrium, multi wall  motion abnormality, mild aortic valve sclerosis without stenosis.   HOSPITAL COURSE: Please refer to H and P done by the admitting physician. The patient is an 81 year old white male who was seen at [facility] for chest pain. He was noted to have a troponin of 27. He was admitted for non-ST MI. He was seen in consultation by Cardiology, and he was admitted to the hospital. The patient was started on a heparin drip, aspirin, beta blockers. He underwent a cardiac catheterization, which showed severe one-vessel disease. Cardiology felt that he would best benefit from medical management. He also did have some collaterals. The patient also was noted to have a significant systolic dysfunction on the echocardiogram, felt to be due to the MI. He was treated with beta blockers and ACE inhibitor during hospitalization. However, his creatinine did worsen a little. Therefore, his ACE inhibitor will be held. At this time, patient is doing much better and his breathing is stable, and he is stable for discharge.   DISCHARGE MEDICATIONS: Aspirin 81 mg 1 tab p.o. daily, nitroglycerin 0.4 sublingual p.r.n., simvastatin 20 mg at bedtime, Plavix 75 p.o. daily, carvedilol 6.25, 1 tab p.o. b.i.d., Lasix 20 daily.   INSTRUCTIONS:  Diet: Low sodium, low fat, low cholesterol. Activity as tolerated. Follow up with primary MD in 1 to 2 weeks. Follow with Dr. Kirke Corin at [facility] in 2 to 4 weeks. BMP this Tuesday in Dr. Jari Sportsman office.   Thirty-five minutes spent.     ____________________________ Lacie Scotts. Allena Katz, MD shp:mr D: 02/27/2013 11:57:01 ET T: 02/27/2013 19:56:11 ET JOB#: [id number]  cc: Cassadie Pankonin H. Allena Katz, MD, <Dictator> Charise Carwin MD ELECTRONICALLY SIGNED 03/04/2013 12:48

## 2014-07-25 ENCOUNTER — Encounter: Payer: Self-pay | Admitting: Internal Medicine

## 2014-07-25 ENCOUNTER — Ambulatory Visit (INDEPENDENT_AMBULATORY_CARE_PROVIDER_SITE_OTHER): Payer: Medicare Other | Admitting: Internal Medicine

## 2014-07-25 VITALS — BP 114/84 | HR 68 | Ht 72.0 in | Wt 190.5 lb

## 2014-07-25 DIAGNOSIS — I2510 Atherosclerotic heart disease of native coronary artery without angina pectoris: Secondary | ICD-10-CM | POA: Diagnosis not present

## 2014-07-25 DIAGNOSIS — I5022 Chronic systolic (congestive) heart failure: Secondary | ICD-10-CM

## 2014-07-25 DIAGNOSIS — I10 Essential (primary) hypertension: Secondary | ICD-10-CM

## 2014-07-25 NOTE — Patient Instructions (Addendum)
Medication Instructions:  Take your coreg 6.25mg  one tablet in the morning and 6.25mg , one and one half tablets (9.375mg )  in the evening.   Labwork: None  Testing/Procedures: None  Follow-Up: Dr. Graciela Husbands will see you back on an as needed basis   Any Other Special Instructions Will Be Listed Below (If Applicable).

## 2014-07-25 NOTE — Progress Notes (Signed)
ELECTROPHYSIOLOGY CONSULT NOTE  Patient ID: Samuel Mcbride, MRN: [id number], DOB/AGE: 08-18-1925 81 y.o. Admit date: (Not on file) Date of Consult: 07/25/2014  Primary Physician: [facility] Primary Cardiologist: PN  Chief Complaint:  ICD   HPI Samuel Mcbride is a 81 y.o. male  Referred for consideration of ICD   He has Hx on ischemic and nonischemic MI with NSTEMI  12/14. His catheterization report was reviewed and it demonstrated isolated diagonal disease.  EF by echo 30-35% consistently over the last couple of years.  Managed wit BB and ARB   He has had some history of volume overload; up titration of diuretics has been limited by renal issues  He has no functional limitations. He can climb stairs. He walks a quarter to half a mile a couple times a day without difficulty. He carries groceries.  He has no nocturnal dyspnea.  He has no history of palpitations or syncope; he has no history of asymmetric neurological symptoms   Past Medical History  Diagnosis Date  . MI (myocardial infarction)   . Hyperlipidemia   . Hypertension   . CHF (congestive heart failure)   . Chronic kidney disease, stage III (moderate)       Surgical History:  Past Surgical History  Procedure Laterality Date  . Cholecystectomy    . Cardiac catheterization  12/14    [facility]     Home Meds: Prior to Admission medications   Medication Sig Start Date End Date Taking? Authorizing Provider  acetaminophen (TYLENOL) 500 MG tablet Take 500 mg by mouth every 4 (four) hours as needed.   Yes Historical Provider, MD  aspirin 81 MG tablet Take 1 tablet (81 mg total) by mouth daily. 04/01/13  Yes Vesta Mixer, MD  carvedilol (COREG) 6.25 MG tablet TAKE 1 TABLET (6.25 MG TOTAL) BY MOUTH 2 (TWO) TIMES DAILY WITH A MEAL. 12/05/13  Yes Vesta Mixer, MD  cetirizine (ZYRTEC) 10 MG tablet Take 10 mg by mouth daily.   Yes Historical Provider, MD  clopidogrel (PLAVIX) 75 MG tablet TAKE 1  TABLET (75 MG TOTAL) BY MOUTH DAILY WITH BREAKFAST. 12/05/13  Yes Vesta Mixer, MD  docusate sodium (COLACE) 250 MG capsule Take 250 mg by mouth daily as needed for constipation.   Yes Historical Provider, MD  furosemide (LASIX) 40 MG tablet Take 40 mg by mouth daily.   Yes Historical Provider, MD  loratadine (CLARITIN) 10 MG tablet Take 10 mg by mouth daily.   Yes Historical Provider, MD  losartan (COZAAR) 50 MG tablet Take 50 mg by mouth daily.   Yes Historical Provider, MD  Multiple Vitamin (MULTIVITAMIN) tablet Take 1 tablet by mouth daily.   Yes Historical Provider, MD  nitroGLYCERIN (NITROSTAT) 0.4 MG SL tablet Place 1 tablet (0.4 mg total) under the tongue every 5 (five) minutes as needed for chest pain. 04/01/13  Yes Vesta Mixer, MD  simvastatin (ZOCOR) 20 MG tablet Take 1 tablet (20 mg total) by mouth daily. 04/01/13  Yes Vesta Mixer, MD      Allergies:  Allergies  Allergen Reactions  . Codeine     dizziness    History   Social History  . Marital Status: Married    Spouse Name: N/A  . Number of Children: N/A  . Years of Education: N/A   Occupational History  . Not on file.   Social History Main Topics  . Smoking status: Never Smoker   . Smokeless tobacco:  Not on file  . Alcohol Use: No  . Drug Use: No  . Sexual Activity: Not on file   Other Topics Concern  . Not on file   Social History Narrative     Family History  Problem Relation Age of Onset  . Heart disease Father      ROS:  Please see the history of present illness.     All other systems reviewed and negative.    Physical Exam: Blood pressure 114/84, pulse 68, height 6' (1.829 m), weight 190 lb 8 oz (86.41 kg). General: Well developed, well nourished male in no acute distress. Head: Normocephalic, atraumatic, sclera non-icteric, no xanthomas, nares are without discharge. EENT: normal Lymph Nodes:  none Back: without scoliosis/kyphosis, no CVA tendersness Neck: Negative for carotid bruits.  JVD not elevated. Lungs: Clear bilaterally to auscultation without wheezes, rales, or rhonchi. Breathing is unlabored. Heart: RRR with DISTANT  S1 S2.  NO  Systolic  murmur , rubs, or gallops appreciated. Abdomen: Soft, non-tender, non-distended with normoactive bowel sounds. No hepatomegaly. No rebound/guarding. No obvious abdominal masses. Msk:  Strength and tone appear normal for age. Extremities: No clubbing or cyanosis. 1+ edema.  Distal pedal pulses are 2+ and equal bilaterally. Skin: Warm and Dry Neuro: Alert and oriented X 3. CN III-XII intact Grossly normal sensory and motor function . Psych:  Responds to questions appropriately with a normal affect.      Labs: Cardiac Enzymes No results for input(s): CKTOTAL, CKMB, TROPONINI in the last 72 hours. CBC Lab Results  Component Value Date   WBC 10.2 02/25/2013   HGB 15.1 02/25/2013   HCT 44.0 02/25/2013   MCV 97 02/25/2013   PLT 186 02/25/2013   PROTIME: No results for input(s): LABPROT, INR in the last 72 hours. Chemistry No results for input(s): NA, K, CL, CO2, BUN, CREATININE, CALCIUM, PROT, BILITOT, ALKPHOS, ALT, AST, GLUCOSE in the last 168 hours.  Invalid input(s): LABALBU Lipids Lab Results  Component Value Date   CHOL 124 06/15/2014   HDL 43 06/15/2014   LDLCALC 53 06/15/2014   TRIG 142 06/15/2014   BNP No results found for: PROBNP Thyroid Function Tests: No results for input(s): TSH, T4TOTAL, T3FREE, THYROIDAB in the last 72 hours.  Invalid input(s): FREET3    Miscellaneous No results found for: DDIMER  Radiology/Studies:  No results found.  EKG: SR   16./14/42 RBBB Axis -165  Assessment and Plan:  Ischemic and Nonischemic Cardiomyopathy  Congestive Heart Failure Class 1  Renal insufficiency Grade 3  Sinus bradycardia-remotely   The patient has a primarily nonischemic cardiomyopathy. He has persistent left ventricular dysfunction, albeit mild. In the absence of symptoms of heart failure,  however, there is no indication for proceeding with an ICD with an ejection fraction above 30 to the degree that his myopathy is ischemic.  We have reviewed this extensively.  He is mildly volume overloaded; however, he says that up titration of diuretics was at limited by his renal function. The absence of symptoms, I would not adjust these.  In addition, we have discussed the issue of mode of dying related to age. His functional status is exceedingly good for an 81 year old, and if his symptoms of heart failure or to emerge, I would be glad to have a conversation with him and his family again regarding ICD implantation as I think there would be measurable benefit over a period of 5 years given his current condition. We discussed the lack of impact of defibrillator therapy  on cardiac performance. In this regard, we will take advantage of his blood pressure and paucity of symptoms and increase his carvedilol gently from 6.25 twice a day--6.25/9.375. This may be limited ultimately by bradycardia  It is also possible that he would be a candidate for aldosterone antagonist to further improve cardiac performance     Sherryl Manges

## 2014-07-28 ENCOUNTER — Telehealth: Payer: Self-pay | Admitting: *Deleted

## 2014-07-28 NOTE — Telephone Encounter (Signed)
Pt c/o medication issue:  1. Name of Medication: Coreg 6.25 mg   2. How are you currently taking this medication (dosage and times per day)? 1 tab am and 1 1/2 pm  3. Are you having a reaction (difficulty breathing--STAT)?  4. What is your medication issue? She is confused on this medication. He has never take this before, please call Rhunette Croft his wife.

## 2014-07-28 NOTE — Telephone Encounter (Signed)
S/w wife Rhunette Croft. Stated she and pt. confused about coreg. Pt unaware coreg and carvedilol same medication. Explained med to patient wife as well as correct dosage amount. Pt wife verbalized understanding and had no further questions.

## 2014-08-15 ENCOUNTER — Telehealth: Payer: Self-pay

## 2014-08-15 NOTE — Telephone Encounter (Signed)
S/w pt wife regarding Vitamin pill. Pt states Dr. Cliffton Asters put him on Vitamin D3 for 8 weeks. Advised wife to contact Dr. Cliffton Asters for further instructions. Wife verbalized understanding.

## 2014-08-15 NOTE — Telephone Encounter (Signed)
Pt wife called, states he is on Vitamin D3 5000 units, for 8 weeks, she asks if pt should continue to stay on this or not. Please advise. Pt will be gone until 1pm

## 2014-08-22 ENCOUNTER — Emergency Department: Payer: Medicare Other

## 2014-08-22 ENCOUNTER — Emergency Department
Admission: EM | Admit: 2014-08-22 | Discharge: 2014-08-22 | Disposition: A | Discharge status: Home / Self Care | Payer: Medicare Other | Attending: Emergency Medicine | Admitting: Emergency Medicine

## 2014-08-22 DIAGNOSIS — J9801 Acute bronchospasm: Secondary | ICD-10-CM

## 2014-08-22 DIAGNOSIS — N183 Chronic kidney disease, stage 3 (moderate): Secondary | ICD-10-CM | POA: Insufficient documentation

## 2014-08-22 DIAGNOSIS — I129 Hypertensive chronic kidney disease with stage 1 through stage 4 chronic kidney disease, or unspecified chronic kidney disease: Secondary | ICD-10-CM | POA: Diagnosis not present

## 2014-08-22 DIAGNOSIS — Z7902 Long term (current) use of antithrombotics/antiplatelets: Secondary | ICD-10-CM | POA: Diagnosis not present

## 2014-08-22 DIAGNOSIS — Z7982 Long term (current) use of aspirin: Secondary | ICD-10-CM | POA: Diagnosis not present

## 2014-08-22 DIAGNOSIS — R066 Hiccough: Secondary | ICD-10-CM | POA: Insufficient documentation

## 2014-08-22 DIAGNOSIS — Z79899 Other long term (current) drug therapy: Secondary | ICD-10-CM | POA: Diagnosis not present

## 2014-08-22 DIAGNOSIS — R05 Cough: Secondary | ICD-10-CM | POA: Diagnosis present

## 2014-08-22 HISTORY — DX: Chronic kidney disease, stage 3 unspecified: N18.30

## 2014-08-22 HISTORY — DX: Chronic kidney disease, stage 3 (moderate): N18.3

## 2014-08-22 MED ORDER — BENZONATATE 100 MG PO CAPS: 100.0000 mg | ORAL_CAPSULE | Freq: Three times a day (TID) | ORAL | Status: AC | PRN

## 2014-08-22 MED ORDER — PROCHLORPERAZINE MALEATE 5 MG PO TABS: 5.0000 mg | ORAL_TABLET | Freq: Four times a day (QID) | ORAL | Status: DC | PRN

## 2014-08-22 MED ORDER — IPRATROPIUM-ALBUTEROL 0.5-2.5 (3) MG/3ML IN SOLN
RESPIRATORY_TRACT | Status: AC
  Administered 2014-08-22: 3 mL via RESPIRATORY_TRACT
  Filled 2014-08-22: qty 3

## 2014-08-22 MED ORDER — IPRATROPIUM-ALBUTEROL 0.5-2.5 (3) MG/3ML IN SOLN
3.0000 mL | Freq: Once | RESPIRATORY_TRACT | Status: AC
  Administered 2014-08-22: 3 mL via RESPIRATORY_TRACT

## 2014-08-22 NOTE — ED Notes (Signed)
Pt c/o cough with sinus and chest congestion for the past 2 days..clear sputum.the patient is in NAD, respirations WNL.Skin WNL

## 2014-08-22 NOTE — Discharge Instructions (Signed)
Bronchospasm °A bronchospasm is a spasm or tightening of the airways going into the lungs. During a bronchospasm breathing becomes more difficult because the airways get smaller. When this happens there can be coughing, a whistling sound when breathing (wheezing), and difficulty breathing. Bronchospasm is often associated with asthma, but not all patients who experience a bronchospasm have asthma. °CAUSES  °A bronchospasm is caused by inflammation or irritation of the airways. The inflammation or irritation may be triggered by:  °· Allergies (such as to animals, pollen, food, or mold). Allergens that cause bronchospasm may cause wheezing immediately after exposure or many hours later.   °· Infection. Viral infections are believed to be the most common cause of bronchospasm.   °· Exercise.   °· Irritants (such as pollution, cigarette smoke, strong odors, aerosol sprays, and paint fumes).   °· Weather changes. Winds increase molds and pollens in the air. Rain refreshes the air by washing irritants out. Cold air may cause inflammation.   °· Stress and emotional upset.   °SIGNS AND SYMPTOMS  °· Wheezing.   °· Excessive nighttime coughing.   °· Frequent or severe coughing with a simple cold.   °· Chest tightness.   °· Shortness of breath.   °DIAGNOSIS  °Bronchospasm is usually diagnosed through a history and physical exam. Tests, such as chest X-rays, are sometimes done to look for other conditions. °TREATMENT  °· Inhaled medicines can be given to open up your airways and help you breathe. The medicines can be given using either an inhaler or a nebulizer machine. °· Corticosteroid medicines may be given for severe bronchospasm, usually when it is associated with asthma. °HOME CARE INSTRUCTIONS  °· Always have a plan prepared for seeking medical care. Know when to call your health care provider and local emergency services (911 in the [place] Know where you can access local emergency care. °· Only take medicines as  directed by your health care provider. °· If you were prescribed an inhaler or nebulizer machine, ask your health care provider to explain how to use it correctly. Always use a spacer with your inhaler if you were given one. °· It is necessary to remain calm during an attack. Try to relax and breathe more slowly.  °· Control your home environment in the following ways:   °¨ Change your heating and air conditioning filter at least once a month.   °¨ Limit your use of fireplaces and wood stoves. °¨ Do not smoke and do not allow smoking in your home.   °¨ Avoid exposure to perfumes and fragrances.   °¨ Get rid of pests (such as roaches and mice) and their droppings.   °¨ Throw away plants if you see mold on them.   °¨ Keep your house clean and dust free.   °¨ Replace carpet with wood, tile, or vinyl flooring. Carpet can trap dander and dust.   °¨ Use allergy-proof pillows, mattress covers, and box spring covers.   °¨ Wash bed sheets and blankets every week in hot water and dry them in a dryer.   °¨ Use blankets that are made of polyester or cotton.   °¨ Wash hands frequently. °SEEK MEDICAL CARE IF:  °· You have muscle aches.   °· You have chest pain.   °· The sputum changes from clear or white to yellow, green, gray, or bloody.   °· The sputum you cough up gets thicker.   °· There are problems that may be related to the medicine you are given, such as a rash, itching, swelling, or trouble breathing.   °SEEK IMMEDIATE MEDICAL CARE IF:  °· You have worsening wheezing and coughing even   after taking your prescribed medicines.   You have increased difficulty breathing.   You develop severe chest pain. MAKE SURE YOU:   Understand these instructions.  Will watch your condition.  Will get help right away if you are not doing well or get worse. Document Released: 03/13/2003 Document Revised: 03/15/2013 Document Reviewed: 08/30/2012 [facility] Patient Information 2015 [place], [place]. This information is not  intended to replace advice given to you by your health care provider. Make sure you discuss any questions you have with your health care provider.   Your exam and chest x-ray are normal today.  You should continue your home meds and consider dosing a Benadryl at bedtime for allergy relief and sleep.  Take the [facility] as directed.  Follow-up with the [facility] if symptoms worsen.

## 2014-08-22 NOTE — ED Provider Notes (Signed)
[facility] Emergency Department Provider Note ____________________________________________  Time seen: 2005  I have reviewed the triage vital signs and the nursing notes.  HISTORY  Chief Complaint Cough and Nasal Congestion  HPI Samuel Mcbride is a 81 y.o. male who reports to the ED with his wife and adult son, after referral from [facility] today. He reports 2 days of intermittent cough with productive clear sputum. He denies any outright fevers, chills, sweats, but the wife notes a low-grade temp of 99. He's been without any nausea or vomiting, chest pain, or shortness of breath. The provider at [facility] clinic Concerns for pneumonia versus flare of his CHF, and suggested that he report for direct evaluation via the ED.   Past Medical History  Diagnosis Date  . MI (myocardial infarction)   . Hyperlipidemia   . Hypertension   . CHF (congestive heart failure)   . Chronic kidney disease, stage III (moderate)   . Kidney disease, chronic, stage III (GFR 30-59 ml/min)    Patient Active Problem List   Diagnosis Date Noted  . CAD (coronary artery disease) 03/01/2013  . Hyperlipidemia 03/01/2013  . Essential hypertension 03/01/2013  . Chronic renal insufficiency, stage II (mild) 03/01/2013  . Chronic systolic congestive heart failure 03/01/2013   Past Surgical History  Procedure Laterality Date  . Cholecystectomy    . Cardiac catheterization  12/14    [facility]   Current Outpatient Rx  Name  Route  Sig  Dispense  Refill  . acetaminophen (TYLENOL) 500 MG tablet   Oral   Take 500 mg by mouth every 4 (four) hours as needed.         Marland Kitchen aspirin 81 MG tablet   Oral   Take 1 tablet (81 mg total) by mouth daily.   30 tablet   3   . carvedilol (COREG) 6.25 MG tablet      TAKE 1 TABLET (6.25 MG TOTAL) BY MOUTH IN THE MORNING AND 1 AND 1/2 TABLETS (9.375MG ) IN THE EVENING         . cetirizine (ZYRTEC) 10 MG tablet   Oral   Take 10 mg by mouth daily.          . clopidogrel (PLAVIX) 75 MG tablet      TAKE 1 TABLET (75 MG TOTAL) BY MOUTH DAILY WITH BREAKFAST.   90 tablet   3   . docusate sodium (COLACE) 250 MG capsule   Oral   Take 250 mg by mouth daily as needed for constipation.         . furosemide (LASIX) 40 MG tablet   Oral   Take 40 mg by mouth daily.         Marland Kitchen lisinopril (PRINIVIL,ZESTRIL) 5 MG tablet   Oral   Take 5 mg by mouth daily.         Marland Kitchen losartan (COZAAR) 50 MG tablet   Oral   Take 50 mg by mouth daily.         . Multiple Vitamin (MULTIVITAMIN) tablet   Oral   Take 1 tablet by mouth daily.         . nitroGLYCERIN (NITROSTAT) 0.4 MG SL tablet   Sublingual   Place 1 tablet (0.4 mg total) under the tongue every 5 (five) minutes as needed for chest pain.   25 tablet   6   . simvastatin (ZOCOR) 20 MG tablet   Oral   Take 1 tablet (20 mg total) by mouth daily.  30 tablet   6   . benzonatate (TESSALON PERLES) 100 MG capsule   Oral   Take 1 capsule (100 mg total) by mouth 3 (three) times daily as needed for cough (Take 1-2 per dose).   30 capsule   0   . loratadine (CLARITIN) 10 MG tablet   Oral   Take 10 mg by mouth daily.          Allergies Codeine  Family History  Problem Relation Age of Onset  . Heart disease Father     Social History History  Substance Use Topics  . Smoking status: Never Smoker   . Smokeless tobacco: Not on file  . Alcohol Use: No   Review of Systems  Constitutional: Negative for fever. Eyes: Negative for visual changes. ENT: Negative for sore throat. Cardiovascular: Negative for chest pain. Respiratory: Negative for shortness of breath. Positive for cough Gastrointestinal: Negative for abdominal pain, vomiting and diarrhea. Genitourinary: Negative for dysuria. Musculoskeletal: Negative for back pain. Skin: Negative for rash. Neurological: Negative for headaches, focal weakness or numbness. ____________________________________________  PHYSICAL  EXAM:  VITAL SIGNS: ED Triage Vitals  Enc Vitals Group     BP 08/22/14 1701 125/75 mmHg     Pulse Rate 08/22/14 1701 86     Resp 08/22/14 1701 18     Temp 08/22/14 1701 98.1 F (36.7 C)     Temp src --      SpO2 08/22/14 1701 94 %     Weight 08/22/14 1701 208 lb (94.348 kg)     Height 08/22/14 1701 6' (1.829 m)     Head Cir --      Peak Flow --      Pain Score --      Pain Loc --      Pain Edu? --      Excl. in GC? --    Constitutional: Alert and oriented. Well appearing and in no distress. HEENT: Normocephalic and atraumatic. Conjunctivae are normal. PERRL. Normal extraocular movements. No congestion/rhinnorhea. Mucous membranes are moist. TMs clear bilaterally. Hematological/Lymphatic/Immunilogical: No cervical lymphadenopathy. Cardiovascular: Normal rate, regular rhythm.  Respiratory: Normal respiratory effort. No wheezes/rales/rhonchi. Gastrointestinal: Soft and nontender. No distention. Musculoskeletal: Nontender with normal range of motion in all extremities. Neurologic:  Normal speech and language. No gross focal neurologic deficits are appreciated. Skin:  Skin is warm, dry and intact. No rash noted. Psychiatric: Mood and affect are normal. Patient exhibits appropriate insight and judgment. ____________________________________________   RADIOLOGY CXR IMPRESSION: Scarring left base. No edema or consolidation. ____________________________________________  PROCEDURES    DuoNeb x 1  ____________________________________________  INITIAL IMPRESSION / ASSESSMENT AND PLAN / ED COURSE  Acute bronchospasm without underlying infectious process. Radiology results to patient and family. Patient reassured about normal chest x-ray. Treatment with Tessalon Perles.  Follow-up with [facility] as needed.  ____________________________________________  FINAL CLINICAL IMPRESSION(S) / ED DIAGNOSES  Final diagnoses:  Bronchospasm, acute     Lissa Hoard,  PA-C 08/22/14 2032  Myrna Blazer, MD 08/22/14 2241

## 2014-08-27 ENCOUNTER — Other Ambulatory Visit: Payer: Self-pay | Admitting: Cardiovascular Disease

## 2014-08-28 ENCOUNTER — Telehealth: Payer: Self-pay | Admitting: *Deleted

## 2014-08-28 NOTE — Telephone Encounter (Signed)
90 day refill.

## 2014-08-29 NOTE — Telephone Encounter (Signed)
Do you know which medications pt mentioned that he wanted refilled?

## 2014-11-01 ENCOUNTER — Other Ambulatory Visit: Payer: Self-pay | Admitting: Cardiovascular Disease

## 2014-11-02 ENCOUNTER — Other Ambulatory Visit: Payer: Self-pay | Admitting: *Deleted

## 2014-11-02 ENCOUNTER — Other Ambulatory Visit: Payer: Self-pay | Admitting: Cardiovascular Disease

## 2014-11-02 MED ORDER — CLOPIDOGREL BISULFATE 75 MG PO TABS: ORAL_TABLET | ORAL | Status: DC

## 2014-11-02 MED ORDER — SIMVASTATIN 20 MG PO TABS: ORAL_TABLET | ORAL | Status: DC

## 2014-11-22 ENCOUNTER — Other Ambulatory Visit: Payer: Self-pay | Admitting: Cardiovascular Disease

## 2014-11-27 ENCOUNTER — Other Ambulatory Visit: Payer: Self-pay | Admitting: Cardiovascular Disease

## 2014-11-28 ENCOUNTER — Telehealth: Payer: Self-pay | Admitting: Cardiovascular Disease

## 2014-11-28 NOTE — Telephone Encounter (Signed)
Patient wife calling to refill all medications.  Patient has upcoming appt on 09/28 to see Nahser and prefers to wait and get them renewed at this visit.

## 2014-12-20 ENCOUNTER — Other Ambulatory Visit: Payer: Self-pay

## 2014-12-20 ENCOUNTER — Ambulatory Visit (INDEPENDENT_AMBULATORY_CARE_PROVIDER_SITE_OTHER): Payer: Medicare Other | Admitting: Cardiovascular Disease

## 2014-12-20 ENCOUNTER — Encounter: Payer: Self-pay | Admitting: Cardiovascular Disease

## 2014-12-20 VITALS — BP 100/70 | HR 63 | Ht 72.0 in | Wt 205.0 lb

## 2014-12-20 DIAGNOSIS — I10 Essential (primary) hypertension: Secondary | ICD-10-CM

## 2014-12-20 DIAGNOSIS — I2510 Atherosclerotic heart disease of native coronary artery without angina pectoris: Secondary | ICD-10-CM | POA: Diagnosis not present

## 2014-12-20 DIAGNOSIS — I5022 Chronic systolic (congestive) heart failure: Secondary | ICD-10-CM

## 2014-12-20 MED ORDER — NITROGLYCERIN 0.4 MG SL SUBL: 0.4000 mg | SUBLINGUAL_TABLET | SUBLINGUAL | Status: DC | PRN

## 2014-12-20 NOTE — Progress Notes (Signed)
Cardiology Office Note   Date:  12/20/2014   ID:  Samuel Mcbride, Samuel Mcbride 1926/03/02, MRN [id number]  PCP:  [facility]  Cardiologist:   Nahser, Deloris Ping, MD   Chief Complaint  Patient presents with  . Other    6 month f/u no complaints. Meds reviewed verbally with pt.   1. Coronary artery disease-status post non-ST segment elevation myocardial infarction due to occlusion of a large second diagonal branch. Medical management was recommended 2. Hyperlipidemia 3. Hypertension 4. Chronic kidney disease-stage II 5. Chronic systolic congestive heart failure-ejection fraction of 30-35% by echo.  History of Present Illness:  Samuel Mcbride is an 81 year old gentleman who presented to [facility] with episodes of chest pain. He was found to have a non-ST segment elevation myocardial infarction. He was taken to the cath lab the following day and was found to have an occlusion of the second diagonal vessel. He had intact collateral filling of the diagonal vessel. It was thought that he should be treated medically.   He's done well since that time. He's not had any episodes of chest pain or shortness of breath. Echocardiogram found acute on chronic systolic congestive heart failure with an ejection fraction of 30-35%. His diet is good. His appetite is good. He's eating low-fat and low-salt choices. He's not had any further episodes of chest discomfort.  March 23 ,2015:  Pt. Is doing well. No dyspnea or CP.   September 02, 2013: He has had a cough - probably before the Lisinopril therapy - not worsened with the Lisinopril. Planted his garden - turnips and tomatoes  Sept. 14, 2015:  Samuel Mcbride is doing much better since we made medicine changes. We discontinued the lisinopril started him on low start. We also added Lasix. His cough has improved. Breathing is much better.  He also has had his flu shot last month   June 15, 2014:  Samuel Mcbride is a  81 y.o. male who presents for follow-up of his congestive heart failure. No dyspnea,  No CP. No orthopnea or   Sept. 28, 2016: \ Pt is doing well.   Has been seen by Dr. Graciela Husbands and was deemed to NOT be a candidate for ICD ( given the ischemic etiology of his CHF and age) Needs refil on NTG ( never uses it )     Past Medical History  Diagnosis Date  . MI (myocardial infarction)   . Hyperlipidemia   . Hypertension   . CHF (congestive heart failure)   . Chronic kidney disease, stage III (moderate)   . Kidney disease, chronic, stage III (GFR 30-59 ml/min)     Past Surgical History  Procedure Laterality Date  . Cholecystectomy    . Cardiac catheterization  12/14    [facility]     Current Outpatient Prescriptions  Medication Sig Dispense Refill  . acetaminophen (TYLENOL) 500 MG tablet Take 500 mg by mouth every 4 (four) hours as needed.    Marland Kitchen aspirin 81 MG tablet Take 1 tablet (81 mg total) by mouth daily. 30 tablet 3  . benzonatate (TESSALON PERLES) 100 MG capsule Take 1 capsule (100 mg total) by mouth 3 (three) times daily as needed for cough (Take 1-2 per dose). 30 capsule 0  . carvedilol (COREG) 6.25 MG tablet Take 1 tablet am and 1 tablet and 1/2 tablet pm daily. (Patient taking differently: Take 1 tablet am, 1 tablet noon and 1/2 tablet pm daily.) 225 tablet 3  . cetirizine (  ZYRTEC) 10 MG tablet Take 10 mg by mouth daily.    . clopidogrel (PLAVIX) 75 MG tablet TAKE 1 TABLET BY MOUTH DAILY WITH BREAKFAST. 90 tablet 0  . docusate sodium (COLACE) 250 MG capsule Take 250 mg by mouth daily as needed for constipation.    . furosemide (LASIX) 40 MG tablet TAKE 1 TABLET (40 MG TOTAL) BY MOUTH DAILY. (Patient taking differently: TAKE 1/2 TABLET (40 MG TOTAL) BY MOUTH DAILY.) 90 tablet 3  . lisinopril (PRINIVIL,ZESTRIL) 5 MG tablet Take 5 mg by mouth daily.    Marland Kitchen losartan (COZAAR) 50 MG tablet Take 50 mg by mouth daily.    . Multiple Vitamin (MULTIVITAMIN) tablet Take 1 tablet by mouth daily.      . nitroGLYCERIN (NITROSTAT) 0.4 MG SL tablet Place 1 tablet (0.4 mg total) under the tongue every 5 (five) minutes as needed for chest pain. 25 tablet 6  . simvastatin (ZOCOR) 20 MG tablet TAKE 1 TABLET (20 MG TOTAL) BY MOUTH DAILY. 90 tablet 3   No current facility-administered medications for this visit.    Allergies:   Codeine    Social History:  The patient  reports that he has never smoked. He does not have any smokeless tobacco history on file. He reports that he does not drink alcohol or use illicit drugs.   Family History:  The patient's family history includes Heart disease in his father.    ROS:  Please see the history of present illness.    Review of Systems: Constitutional:  denies fever, chills, diaphoresis, appetite change and fatigue.  HEENT: denies photophobia, eye pain, redness, hearing loss, ear pain, congestion, sore throat, rhinorrhea, sneezing, neck pain, neck stiffness and tinnitus.  Respiratory: denies SOB, DOE, cough, chest tightness, and wheezing.  Cardiovascular: denies chest pain, palpitations and leg swelling.  Gastrointestinal: denies nausea, vomiting, abdominal pain, diarrhea, constipation, blood in stool.  Genitourinary: denies dysuria, urgency, frequency, hematuria, flank pain and difficulty urinating.  Musculoskeletal: denies  myalgias, back pain, joint swelling, arthralgias and gait problem.   Skin: denies pallor, rash and wound.  Neurological: denies dizziness, seizures, syncope, weakness, light-headedness, numbness and headaches.   Hematological: denies adenopathy, easy bruising, personal or family bleeding history.  Psychiatric/ Behavioral: denies suicidal ideation, mood changes, confusion, nervousness, sleep disturbance and agitation.       All other systems are reviewed and negative.    PHYSICAL EXAM: VS:  BP 100/70 mmHg  Pulse 63  Ht 6' (1.829 m)  Wt 92.987 kg (205 lb)  BMI 27.80 kg/m2 , BMI Body mass index is 27.8 kg/(m^2). GEN:  Well nourished, well developed, in no acute distress HEENT: normal Neck: no JVD, carotid bruits, or masses Cardiac: RRR; no murmurs, rubs, or gallops,no edema  Respiratory:  clear to auscultation bilaterally, normal work of breathing GI: soft, nontender, nondistended, + BS MS: no deformity or atrophy Skin: warm and dry, no rash Neuro:  Strength and sensation are intact Psych: normal   EKG:  EKG is ordered today. The ekg ordered today demonstrates normal sinus rhythm at 60. 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   Recent Labs: 06/15/2014: ALT 16; BUN 20; Creatinine, Ser 1.58*; Potassium 3.9; Sodium 140    Lipid Panel    Component Value Date/Time   CHOL 124 06/15/2014 0909   CHOL 138 02/25/2013 0357   TRIG 142 06/15/2014 0909   TRIG 134 02/25/2013 0357   HDL 43 06/15/2014 0909   HDL 40 02/25/2013  0357   CHOLHDL 2.9 06/15/2014 0909   VLDL 27 02/25/2013 0357   LDLCALC 53 06/15/2014 0909   LDLCALC 71 02/25/2013 0357      Wt Readings from Last 3 Encounters:  12/20/14 92.987 kg (205 lb)  08/22/14 94.348 kg (208 lb)  07/25/14 86.41 kg (190 lb 8 oz)      Other studies Reviewed: Additional studies/ records that were reviewed today include: labs from [facility] Review of the above records demonstrates: normal electrolytes.    ASSESSMENT AND PLAN:  1. Coronary artery disease-status post non-ST segment elevation myocardial infarction due to occlusion of a large second diagonal branch. Medical management was recommended. He's not having any angina. We'll check his fasting lipids today.  2. Hyperlipidemia - we'll check fasting labs in 6 months when he returns . Continue current medications. 3. Hypertension 4. Chronic kidney disease-stage II 5. Chronic systolic congestive heart failure-ejection fraction of 30-35% by echo. - He's doing very well. Continue current dose of carvedilol and lisinopril.   He has been seen by Dr.  Graciela Husbands and was deemed to NOT be a candidate for ICD. I agree that he is doing well . Continue current meds.    Current medicines are reviewed at length with the patient today.  The patient does not have concerns regarding medicines.  The following changes have been made:  no change  Labs/ tests ordered today include: Orders Placed This Encounter  Procedures  . EKG 12-Lead     Disposition:   FU with [place]  in 6 months    Signed, Nahser, Deloris Ping, MD  12/20/2014 10:25 AM    [facility] [address] [place], [place], [place]  [address] Phone: [phone]; Fax: [phone]

## 2014-12-20 NOTE — Patient Instructions (Signed)
Medication Instructions:  Your physician recommends that you continue on your current medications as directed. Please refer to the Current Medication list given to you today.   Labwork: Your physician recommends that you return for a FASTING lipid and liver profile in six months. Nothing to eat or drink after midnight the evening before your labs.    Testing/Procedures: none  Follow-Up: Your physician wants you to follow-up in: six months.  You will receive a reminder letter in the mail two months in advance. If you don't receive a letter, please call our office to schedule the follow-up appointment.   Any Other Special Instructions Will Be Listed Below (If Applicable).

## 2014-12-21 ENCOUNTER — Other Ambulatory Visit: Payer: Self-pay | Admitting: Cardiovascular Disease

## 2015-02-21 ENCOUNTER — Other Ambulatory Visit: Payer: Self-pay | Admitting: *Deleted

## 2015-02-21 MED ORDER — SIMVASTATIN 20 MG PO TABS: ORAL_TABLET | ORAL | Status: DC

## 2015-06-13 ENCOUNTER — Encounter: Payer: Self-pay | Admitting: Cardiology

## 2015-06-13 ENCOUNTER — Ambulatory Visit (INDEPENDENT_AMBULATORY_CARE_PROVIDER_SITE_OTHER): Payer: Medicare Other | Admitting: Cardiology

## 2015-06-13 VITALS — BP 104/80 | HR 58 | Ht 72.0 in | Wt 201.8 lb

## 2015-06-13 DIAGNOSIS — E785 Hyperlipidemia, unspecified: Secondary | ICD-10-CM

## 2015-06-13 DIAGNOSIS — I2510 Atherosclerotic heart disease of native coronary artery without angina pectoris: Secondary | ICD-10-CM | POA: Diagnosis not present

## 2015-06-13 DIAGNOSIS — I5022 Chronic systolic (congestive) heart failure: Secondary | ICD-10-CM | POA: Diagnosis not present

## 2015-06-13 DIAGNOSIS — I10 Essential (primary) hypertension: Secondary | ICD-10-CM | POA: Diagnosis not present

## 2015-06-13 NOTE — Patient Instructions (Signed)
Medication Instructions:  Your physician recommends that you continue on your current medications as directed. Please refer to the Current Medication list given to you today.   Labwork: Your physician recommends that you return for lab work. These are FASTING labs. CMP and Fasting lipid profile.  Date & Time: ____________________________________________  Testing/Procedures: None ordered  Follow-Up: Your physician wants you to follow-up in: 6 months with Dr. Alvino Chapel. You will receive a reminder letter in the mail two months in advance. If you don't receive a letter, please call our office to schedule the follow-up appointment.    Any Other Special Instructions Will Be Listed Below (If Applicable).     If you need a refill on your cardiac medications before your next appointment, please call your pharmacy.

## 2015-06-13 NOTE — Progress Notes (Addendum)
Cardiology Office Note   Date:  06/13/2015   ID:  Kristain, Hu [birth year], MRN [id number]  Referring Doctor:  [place]   Cardiologist:   Wende Bushy, MD   Reason for consultation:  Chief Complaint  Patient presents with  . other    Former Nahser pt 6 month f/u no complaints today. Meds reviewed verbally with pt.      History of Present Illness: Samuel Mcbride is a 82 y.o. male who presents for follow-up for CAD and CHF.   In terms of CHF, he does not report any shortness of breath. He is able to walk 2 miles at a time almost every day , when he is able to , without chest pain or shortness of breath. He denies PND, orthopnea, edema.   In terms of CAD, patient has not had any episodes of chest pain or shortness of breath. Overall he feels well  Patient denies headache, fever, cough, colds, abdominal pain, loss of consciousness, palpitations.   ROS:  Please see the history of present illness. Aside from mentioned under HPI, all other systems are reviewed and negative.     Past Medical History  Diagnosis Date  . MI (myocardial infarction) ([place])   . Hyperlipidemia   . Hypertension   . CHF (congestive heart failure) ([place])   . Chronic kidney disease, stage III (moderate)   . Kidney disease, chronic, stage III (GFR 30-59 ml/min)     Past Surgical History  Procedure Laterality Date  . Cholecystectomy    . Cardiac catheterization  12/14    [place]     reports that he has never smoked. He does not have any smokeless tobacco history on file. He reports that he does not drink alcohol or use illicit drugs.   family history includes Heart disease in his father.   Current Outpatient Prescriptions  Medication Sig Dispense Refill  . acetaminophen (TYLENOL) 500 MG tablet Take 500 mg by mouth every 4 (four) hours as needed.    Marland Kitchen aspirin 81 MG tablet Take 1 tablet (81 mg total) by mouth daily. 30 tablet 3  . benzonatate (TESSALON PERLES) 100 MG  capsule Take 1 capsule (100 mg total) by mouth 3 (three) times daily as needed for cough (Take 1-2 per dose). 30 capsule 0  . carvedilol (COREG) 6.25 MG tablet Take 1 tablet am and 1 tablet and 1/2 tablet pm daily. (Patient taking differently: Take 1 tablet am, 1 tablet noon and 1/2 tablet pm daily.) 225 tablet 3  . cetirizine (ZYRTEC) 10 MG tablet Take 10 mg by mouth daily.    . clopidogrel (PLAVIX) 75 MG tablet TAKE 1 TABLET BY MOUTH DAILY WITH BREAKFAST. 90 tablet 3  . docusate sodium (COLACE) 250 MG capsule Take 250 mg by mouth daily as needed for constipation.    . furosemide (LASIX) 40 MG tablet Take 20 mg by mouth daily.    Marland Kitchen lisinopril (PRINIVIL,ZESTRIL) 5 MG tablet Take 5 mg by mouth daily.    Marland Kitchen losartan (COZAAR) 50 MG tablet TAKE 1 TABLET (50 MG TOTAL) BY MOUTH DAILY. 90 tablet 3  . Multiple Vitamin (MULTIVITAMIN) tablet Take 1 tablet by mouth daily.    . nitroGLYCERIN (NITROSTAT) 0.4 MG SL tablet Place 1 tablet (0.4 mg total) under the tongue every 5 (five) minutes as needed for chest pain. 25 tablet 3  . simvastatin (ZOCOR) 20 MG tablet TAKE 1 TABLET (20 MG TOTAL) BY MOUTH DAILY. 90 tablet 3  No current facility-administered medications for this visit.    Allergies: Codeine    PHYSICAL EXAM: VS:  BP 104/80 mmHg  Pulse 58  Ht 6' (1.829 m)  Wt 201 lb 12 oz (91.513 kg)  BMI 27.36 kg/m2 , Body mass index is 27.36 kg/(m^2). Wt Readings from Last 3 Encounters:  06/13/15 201 lb 12 oz (91.513 kg)  12/20/14 205 lb (92.987 kg)  08/22/14 208 lb (94.348 kg)    GENERAL:  well developed, well nourished, overweight, not in acute distress HEENT: normocephalic, pink conjunctivae, anicteric sclerae, no xanthelasma, normal dentition, oropharynx clear NECK:  no neck vein engorgement, JVP normal, no hepatojugular reflux, carotid upstroke brisk and symmetric, no bruit, no thyromegaly, no lymphadenopathy LUNGS:  good respiratory effort, clear to auscultation bilaterally CV:  PMI not displaced,  no thrills, no lifts, S1 and S2 within normal limits, no palpable S3 or S4, no murmurs, no rubs, no gallops ABD:  Soft, nontender, nondistended, normoactive bowel sounds, no abdominal aortic bruit, no hepatomegaly, no splenomegaly MS: nontender back, no kyphosis, no scoliosis, no joint deformities EXT:  2+ DP/PT pulses, no edema, no varicosities, no cyanosis, no clubbing SKIN: warm, nondiaphoretic, normal turgor, no ulcers NEUROPSYCH: alert, oriented to person, place, and time, sensory/motor grossly intact, normal mood, appropriate affect  Recent Labs: 06/15/2014: ALT 16; BUN 20; Creatinine, Ser 1.58*; Potassium 3.9; Sodium 140   Lipid Panel    Component Value Date/Time   CHOL 124 06/15/2014 0909   CHOL 138 02/25/2013 0357   TRIG 142 06/15/2014 0909   TRIG 134 02/25/2013 0357   HDL 43 06/15/2014 0909   HDL 40 02/25/2013 0357   CHOLHDL 2.9 06/15/2014 0909   VLDL 27 02/25/2013 0357   LDLCALC 53 06/15/2014 0909   LDLCALC 71 02/25/2013 0357     Other studies Reviewed:  EKG:  EKG from 06/13/2015 was personally reviewed by me and revealed sinus bradycardia, sinus arrhythmia, 58 BPM. Left axis deviation. Low voltage QRS. Nonspecific ST abnormality. Unchanged from previous EKG.  Additional studies/ records that were reviewed personally reviewed by me today include:   echocardiogram 02/25/2013: 1. Left ventricular ejection fraction, by visual estimation, is 30 to  35%.  2. Moderately to severely decreased global left ventricular systolic  function.  3. Entire apex, mid and apical anterior wall, mid and apical anterior  septum, and mid anterolateral segment are abnormal.  4. Mildly dilated left atrium.  5. Mild mitral annular dilatation.  6. Mild aortic valve sclerosis without stenosis.   Cardiac catheterization 02/25/2013: Severe 1vessel CAD with occluded large D2 at the ostium, likely culprit for MI. Faint left to left collaterals.   ASSESSMENT AND  PLAN:  Coronary artery disease-status post non-ST segment elevation myocardial infarction due to occlusion of a large second diagonal branch. Medical management was recommended. He does not report angina or shortness of breath. Continue medical therapy as before. Patient has been on aspirin, Plavix, carvedilol, lisinopril, losartan, simvastatin. He has been stable on these medications for a long time. Recommend to check CMP and FLP.  Chronic systolic congestive heart failure-ejection fraction of 30-35% by echo Patient appears euvolemic. No evidence of acute decompensation. No complaints of shortness of breath. Continue current regimen.  He has been seen by Dr. Caryl Comes and was deemed to NOT be a candidate for ICD.  Hyperlipidemia  Continue current medications. Recommend to check CMP and FLP  Hypertension Blood pressure has been within the normal range. According to patient and wife, heart rate has been greater than  60s. They will continue to monitor. They will call the office if they see that the heart rate is less than 60 most of the time. Will likely adjust the carvedilol at that time.  Current medicines are reviewed at length with the patient today.  The patient does not have concerns regarding medicines.  Labs/ tests ordered today include:  Orders Placed This Encounter  Procedures  . Comp Met (CMET)  . Lipid Profile  . EKG 12-Lead    I had a lengthy and detailed discussion with the patient regarding diagnoses, prognosis, diagnostic options, treatment options, and side effects of medications.   I counseled the patient on importance of lifestyle modification including heart healthy diet, regular physical activity.  Disposition:   FU with undersigned in 6 months  Signed, Wende Bushy, MD  06/13/2015 10:12 AM    [place]

## 2015-06-15 ENCOUNTER — Other Ambulatory Visit (INDEPENDENT_AMBULATORY_CARE_PROVIDER_SITE_OTHER): Payer: Medicare Other

## 2015-06-15 DIAGNOSIS — I2510 Atherosclerotic heart disease of native coronary artery without angina pectoris: Secondary | ICD-10-CM

## 2015-06-15 DIAGNOSIS — I10 Essential (primary) hypertension: Secondary | ICD-10-CM

## 2015-06-15 DIAGNOSIS — E785 Hyperlipidemia, unspecified: Secondary | ICD-10-CM

## 2015-06-15 DIAGNOSIS — I5022 Chronic systolic (congestive) heart failure: Secondary | ICD-10-CM

## 2015-06-16 LAB — COMPREHENSIVE METABOLIC PANEL
ALBUMIN: 3.6 g/dL (ref 3.5–4.7)
ALT: 13 IU/L (ref 0–44)
AST: 20 IU/L (ref 0–40)
Albumin/Globulin Ratio: 1.4 (ref 1.2–2.2)
Alkaline Phosphatase: 76 IU/L (ref 39–117)
BUN / CREAT RATIO: 15 (ref 10–22)
BUN: 22 mg/dL (ref 8–27)
Bilirubin Total: 0.3 mg/dL (ref 0.0–1.2)
CO2: 23 mmol/L (ref 18–29)
Calcium: 8.9 mg/dL (ref 8.6–10.2)
Chloride: 102 mmol/L (ref 96–106)
Creatinine, Ser: 1.5 mg/dL — ABNORMAL HIGH (ref 0.76–1.27)
GFR calc Af Amer: 48 mL/min/{1.73_m2} — ABNORMAL LOW (ref >59)
GFR calc non Af Amer: 41 mL/min/{1.73_m2} — ABNORMAL LOW (ref >59)
Globulin, Total: 2.5 g/dL (ref 1.5–4.5)
Glucose: 95 mg/dL (ref 65–99)
Potassium: 4.5 mmol/L (ref 3.5–5.2)
Sodium: 140 mmol/L (ref 134–144)
Total Protein: 6.1 g/dL (ref 6.0–8.5)

## 2015-06-16 LAB — LIPID PANEL
CHOL/HDL RATIO: 2.7 ratio (ref 0.0–5.0)
CHOLESTEROL TOTAL: 113 mg/dL (ref 100–199)
HDL: 42 mg/dL (ref >39)
LDL Calculated: 50 mg/dL (ref 0–99)
TRIGLYCERIDES: 103 mg/dL (ref 0–149)
VLDL Cholesterol Cal: 21 mg/dL (ref 5–40)

## 2015-10-20 ENCOUNTER — Other Ambulatory Visit: Payer: Self-pay | Admitting: Internal Medicine

## 2015-12-06 NOTE — Progress Notes (Signed)
Cardiology Office Note   Date:  12/10/2015   ID:  Samuel Mcbride, DOB Feb 20, 1926, MRN [id number]  Referring Doctor:  [facility]   Cardiologist:   Almond Lint, MD   Reason for consultation:  Chief Complaint  Patient presents with  . other    6 month f/u. Meds reviewed verbally with pt.      History of Present Illness: Samuel Mcbride is a 82 y.o. male who presents for follow-up for CAD and CHF.   In terms of CHF, he does not report any shortness of breath. He is able to walk  1 mile every day , without chest pain or shortness of breath. He denies PND, orthopnea, edema.   In terms of CAD, patient has not had any episodes of chest pain or shortness of breath. Overall he feels well  Patient denies headache, fever, cough, colds, abdominal pain, loss of consciousness, palpitations.   ROS:  Please see the history of present illness. Aside from mentioned under HPI, all other systems are reviewed and negative.     Past Medical History:  Diagnosis Date  . CHF (congestive heart failure) (HCC)   . Chronic kidney disease, stage III (moderate)   . Hyperlipidemia   . Hypertension   . Kidney disease, chronic, stage III (GFR 30-59 ml/min)   . MI (myocardial infarction) [facility])     Past Surgical History:  Procedure Laterality Date  . CARDIAC CATHETERIZATION  12/14   [facility]  . CHOLECYSTECTOMY       reports that he has never smoked. He has never used smokeless tobacco. He reports that he does not drink alcohol or use drugs.   family history includes Heart disease in his father.   Current Outpatient Prescriptions  Medication Sig Dispense Refill  . acetaminophen (TYLENOL) 500 MG tablet Take 500 mg by mouth every 4 (four) hours as needed.    Marland Kitchen aspirin 81 MG tablet Take 1 tablet (81 mg total) by mouth daily. 30 tablet 3  . benzonatate (TESSALON PERLES) 100 MG capsule Take 1 capsule (100 mg total) by mouth 3 (three) times daily as needed for cough (Take 1-2  per dose). 30 capsule 0  . carvedilol (COREG) 6.25 MG tablet TAKE 1 TABLET AM AND 1 TABLET AND 1/2 TABLET PM DAILY. 225 tablet 3  . cetirizine (ZYRTEC) 10 MG tablet Take 10 mg by mouth daily.    . clopidogrel (PLAVIX) 75 MG tablet TAKE 1 TABLET BY MOUTH DAILY WITH BREAKFAST. 90 tablet 3  . docusate sodium (COLACE) 250 MG capsule Take 250 mg by mouth daily as needed for constipation.    . furosemide (LASIX) 40 MG tablet Take 20 mg by mouth daily.    Marland Kitchen lisinopril (PRINIVIL,ZESTRIL) 5 MG tablet Take 5 mg by mouth daily.    Marland Kitchen losartan (COZAAR) 50 MG tablet TAKE 1 TABLET (50 MG TOTAL) BY MOUTH DAILY. 90 tablet 3  . montelukast (SINGULAIR) 10 MG tablet Take 10 mg by mouth at bedtime.    . Multiple Vitamin (MULTIVITAMIN) tablet Take 1 tablet by mouth daily.    . nitroGLYCERIN (NITROSTAT) 0.4 MG SL tablet Place 1 tablet (0.4 mg total) under the tongue every 5 (five) minutes as needed for chest pain. 25 tablet 3  . simvastatin (ZOCOR) 20 MG tablet TAKE 1 TABLET (20 MG TOTAL) BY MOUTH DAILY. 90 tablet 3   No current facility-administered medications for this visit.     Allergies: Codeine    PHYSICAL EXAM:  VS:  BP 112/64 (BP Location: Left Arm, Patient Position: Sitting, Cuff Size: Normal)   Pulse 63   Ht 6\' 1"  (1.854 m)   Wt 203 lb 12 oz (92.4 kg)   BMI 26.88 kg/m  , Body mass index is 26.88 kg/m. Wt Readings from Last 3 Encounters:  12/10/15 203 lb 12 oz (92.4 kg)  06/13/15 201 lb 12 oz (91.5 kg)  12/20/14 205 lb (93 kg)    GENERAL:  well developed, well nourished, overweight, not in acute distress HEENT: normocephalic, pink conjunctivae, anicteric sclerae, no xanthelasma, normal dentition, oropharynx clear NECK:  no neck vein engorgement, JVP normal, no hepatojugular reflux, carotid upstroke brisk and symmetric, no bruit, no thyromegaly, no lymphadenopathy LUNGS:  good respiratory effort, clear to auscultation bilaterally CV:  PMI not displaced, no thrills, no lifts, S1 and S2 within  normal limits, no palpable S3 or S4, no murmurs, no rubs, no gallops ABD:  Soft, nontender, nondistended, normoactive bowel sounds, no abdominal aortic bruit, no hepatomegaly, no splenomegaly MS: nontender back, no kyphosis, no scoliosis, no joint deformities EXT:  2+ DP/PT pulses, no edema, no varicosities, no cyanosis, no clubbing SKIN: warm, nondiaphoretic, normal turgor, no ulcers NEUROPSYCH: alert, oriented to person, place, and time, sensory/motor grossly intact, normal mood, appropriate affect  Recent Labs: 06/15/2015: ALT 13; BUN 22; Creatinine, Ser 1.50; Potassium 4.5; Sodium 140   Lipid Panel    Component Value Date/Time   CHOL 113 06/15/2015 0810   CHOL 138 02/25/2013 0357   TRIG 103 06/15/2015 0810   TRIG 134 02/25/2013 0357   HDL 42 06/15/2015 0810   HDL 40 02/25/2013 0357   CHOLHDL 2.7 06/15/2015 0810   VLDL 27 02/25/2013 0357   LDLCALC 50 06/15/2015 0810   LDLCALC 71 02/25/2013 0357     Other studies Reviewed:  EKG:  EKG from 06/13/2015 was personally reviewed by me and revealed sinus bradycardia, sinus arrhythmia, 58 BPM. Left axis deviation. Low voltage QRS. Nonspecific ST abnormality. Unchanged from previous EKG.  EKG from 12/10/2015 was personally reviewed by me and it reveals sinus rhythm, 63 BPM. Low-voltage QRS. LAD/left anterior fascicular block. No significant change from previous EKG 06/13/2015.  Additional studies/ records that were reviewed personally reviewed by me today include:   echocardiogram 02/25/2013: 1. Left ventricular ejection fraction, by visual estimation, is 30 to  35%.  2. Moderately to severely decreased global left ventricular systolic  function.  3. Entire apex, mid and apical anterior wall, mid and apical anterior  septum, and mid anterolateral segment are abnormal.  4. Mildly dilated left atrium.  5. Mild mitral annular dilatation.  6. Mild aortic valve sclerosis without stenosis.   Cardiac catheterization  02/25/2013: Severe 1vessel CAD with occluded large D2 at the ostium, likely culprit for MI. Faint left to left collaterals.   ASSESSMENT AND PLAN:  Coronary artery disease-status post non-ST segment elevation myocardial infarction due to occlusion of a large second diagonal branch. Medical management was recommended. He does not report angina or shortness of breath. Continue medical therapy as before. Patient has been on aspirin, Plavix, carvedilol, lisinopril, losartan, simvastatin. He has been stable on these medications for a long time.  Chronic systolic congestive heart failure-ejection fraction of 30-35% by echo Patient appears euvolemic. No evidence of acute decompensation. No complaints of shortness of breath. Continue current regimen.  He has been seen by Dr. Graciela HusbandsKlein and was deemed to NOT be a candidate for ICD.  Hyperlipidemia  Continue current medications.   LDL goal  less than 70LDL is at goal from blood work March 2017.  Hypertension Blood pressure has been within the normal range. According to patient and wife, heart rate has been greater than 60s. They will continue to monitor. They will call the office if they see that the heart rate is less than 60 most of the time. Will likely adjust the carvedilol at that time.  Current medicines are reviewed at length with the patient today.  The patient does not have concerns regarding medicines.  Labs/ tests ordered today include:  Orders Placed This Encounter  Procedures  . EKG 12-Lead    I had a lengthy and detailed discussion with the patient regarding diagnoses, prognosis, diagnostic options, treatment options, and side effects of medications.   I counseled the patient on importance of lifestyle modification including heart healthy diet, regular physical activity.  Disposition:   FU with undersigned in 6 months  Signed, Almond Lint, MD  12/10/2015 12:53 PM    [facility]

## 2015-12-07 ENCOUNTER — Other Ambulatory Visit: Payer: Self-pay | Admitting: Cardiovascular Disease

## 2015-12-10 ENCOUNTER — Ambulatory Visit (INDEPENDENT_AMBULATORY_CARE_PROVIDER_SITE_OTHER): Payer: Medicare Other | Admitting: Cardiology

## 2015-12-10 ENCOUNTER — Encounter: Payer: Self-pay | Admitting: Cardiology

## 2015-12-10 VITALS — BP 112/64 | HR 63 | Ht 73.0 in | Wt 203.8 lb

## 2015-12-10 DIAGNOSIS — I10 Essential (primary) hypertension: Secondary | ICD-10-CM | POA: Diagnosis not present

## 2015-12-10 DIAGNOSIS — I5022 Chronic systolic (congestive) heart failure: Secondary | ICD-10-CM | POA: Diagnosis not present

## 2015-12-10 DIAGNOSIS — I2510 Atherosclerotic heart disease of native coronary artery without angina pectoris: Secondary | ICD-10-CM

## 2015-12-10 DIAGNOSIS — E785 Hyperlipidemia, unspecified: Secondary | ICD-10-CM

## 2015-12-10 NOTE — Patient Instructions (Signed)
Follow-Up: Your physician wants you to follow-up in: 6 months with Dr. Ingal. You will receive a reminder letter in the mail two months in advance. If you don't receive a letter, please call our office to schedule the follow-up appointment.  It was a pleasure seeing you today here in the office. Please do not hesitate to give us a call back if you have any further questions. [phone]  Cabella Kimm A. RN, BSN    

## 2016-01-22 ENCOUNTER — Other Ambulatory Visit: Payer: Self-pay | Admitting: Cardiovascular Disease

## 2016-02-27 ENCOUNTER — Other Ambulatory Visit: Payer: Self-pay | Admitting: Cardiovascular Disease

## 2016-03-05 ENCOUNTER — Other Ambulatory Visit: Payer: Self-pay | Admitting: Cardiovascular Disease

## 2016-03-05 NOTE — Telephone Encounter (Signed)
Please review for refill. Thanks!  

## 2016-03-05 NOTE — Telephone Encounter (Signed)
Ok to refill 

## 2016-03-29 ENCOUNTER — Other Ambulatory Visit: Payer: Self-pay | Admitting: Cardiovascular Disease

## 2016-03-31 NOTE — Telephone Encounter (Signed)
Please review for refill. Thanks! The patient had seen Dr. Elease Hashimoto in the past but now is seeing Dr. Alvino Chapel.

## 2016-03-31 NOTE — Telephone Encounter (Signed)
Please review for refill. Thanks!  

## 2016-06-10 ENCOUNTER — Encounter: Payer: Self-pay | Admitting: Cardiology

## 2016-06-10 ENCOUNTER — Telehealth: Payer: Self-pay | Admitting: Cardiology

## 2016-06-10 ENCOUNTER — Ambulatory Visit (INDEPENDENT_AMBULATORY_CARE_PROVIDER_SITE_OTHER): Payer: Medicare Other | Admitting: Cardiology

## 2016-06-10 VITALS — BP 120/80 | HR 61 | Ht 72.0 in | Wt 202.0 lb

## 2016-06-10 DIAGNOSIS — E784 Other hyperlipidemia: Secondary | ICD-10-CM

## 2016-06-10 DIAGNOSIS — I5022 Chronic systolic (congestive) heart failure: Secondary | ICD-10-CM | POA: Diagnosis not present

## 2016-06-10 DIAGNOSIS — I2510 Atherosclerotic heart disease of native coronary artery without angina pectoris: Secondary | ICD-10-CM

## 2016-06-10 DIAGNOSIS — E7849 Other hyperlipidemia: Secondary | ICD-10-CM

## 2016-06-10 DIAGNOSIS — I10 Essential (primary) hypertension: Secondary | ICD-10-CM

## 2016-06-10 NOTE — Telephone Encounter (Signed)
Patients caregiver called back to make [place] aware that he is not taking the lisinopril. She also wanted to know if he needed the repeat labs next week. Let her know that I would make Dr. Alvino Chapel aware and I will call her back if he does not need to have those labs done. She was appreciative for the call back and had no further questions at this time.

## 2016-06-10 NOTE — Addendum Note (Signed)
Addended by: Bryna Colander on: 06/10/2016 03:55 PM   Modules accepted: Orders

## 2016-06-10 NOTE — Telephone Encounter (Signed)
Pt  Wife called and states pt is not on lisinopril .

## 2016-06-10 NOTE — Progress Notes (Signed)
Cardiology Office Note   Date:  06/10/2016   ID:  [name], Samuel Mcbride 04, 1927, MRN [id number]  Referring Doctor:  [facility]   Cardiologist:   Almond Lint, MD   Reason for consultation:  Chief Complaint  Patient presents with  . other    6 month follow up. Meds reviewed by the pt. verbally. "doing well."       History of Present Illness: Samuel Mcbride is a 83 y.o. male who presents for Follow-up for CAD and CHF.   In terms of CHF, patient denies shortness of breath. No PND, orthopnea, edema. He denies chest pain and shortness of breath.   In terms of CAD, patient denies any chest pains. He has not needed any nitroglycerin sublingual.   No reports of fever, cough, colds, abdominal pain. No syncope. No palpitations.   ROS:  Please see the history of present illness. Aside from mentioned under HPI, all other systems are reviewed and negative.    Past Medical History:  Diagnosis Date  . CHF (congestive heart failure) (HCC)   . Chronic kidney disease, stage III (moderate)   . Hyperlipidemia   . Hypertension   . Kidney disease, chronic, stage III (GFR 30-59 ml/min)   . MI (myocardial infarction)     Past Surgical History:  Procedure Laterality Date  . CARDIAC CATHETERIZATION  12/14   [facility]  . CHOLECYSTECTOMY       reports that he has never smoked. He has never used smokeless tobacco. He reports that he does not drink alcohol or use drugs.   family history includes Heart disease in his father.   Current Outpatient Prescriptions  Medication Sig Dispense Refill  . acetaminophen (TYLENOL) 500 MG tablet Take 500 mg by mouth every 4 (four) hours as needed.    Marland Kitchen aspirin 81 MG tablet Take 1 tablet (81 mg total) by mouth daily. 30 tablet 3  . benzonatate (TESSALON PERLES) 100 MG capsule Take 1 capsule (100 mg total) by mouth 3 (three) times daily as needed for cough (Take 1-2 per dose). 30 capsule 0  . carvedilol (COREG) 6.25 MG tablet TAKE  1 TABLET AM AND 1 TABLET AND 1/2 TABLET PM DAILY. 225 tablet 3  . cetirizine (ZYRTEC) 10 MG tablet Take 10 mg by mouth daily.    . clopidogrel (PLAVIX) 75 MG tablet TAKE 1 TABLET BY MOUTH DAILY WITH BREAKFAST. 90 tablet 1  . docusate sodium (COLACE) 250 MG capsule Take 250 mg by mouth daily as needed for constipation.    . furosemide (LASIX) 40 MG tablet Take 20 mg by mouth daily.    Marland Kitchen lisinopril (PRINIVIL,ZESTRIL) 5 MG tablet Take 5 mg by mouth daily.    Marland Kitchen losartan (COZAAR) 50 MG tablet TAKE 1 TABLET (50 MG TOTAL) BY MOUTH DAILY. 90 tablet 3  . Multiple Vitamin (MULTIVITAMIN) tablet Take 1 tablet by mouth daily.    . nitroGLYCERIN (NITROSTAT) 0.4 MG SL tablet PLACE 1 TABLET (0.4 MG TOTAL) UNDER THE TONGUE EVERY 5 (FIVE) MINUTES AS NEEDED FOR CHEST PAIN. 25 tablet 0  . simvastatin (ZOCOR) 20 MG tablet TAKE 1 TABLET (20 MG TOTAL) BY MOUTH DAILY. 90 tablet 3   No current facility-administered medications for this visit.     Allergies: Codeine    PHYSICAL EXAM: VS:  BP 120/80 (BP Location: Left Arm, Patient Position: Sitting, Cuff Size: Normal)   Pulse 61   Ht 6' (1.829 m)   Wt 202 lb (91.6 kg)  BMI 27.40 kg/m  , Body mass index is 27.4 kg/m. Wt Readings from Last 3 Encounters:  06/10/16 202 lb (91.6 kg)  12/10/15 203 lb 12 oz (92.4 kg)  06/13/15 201 lb 12 oz (91.5 kg)    GENERAL:  well developed, well nourished, obese, not in acute distress HEENT: normocephalic, pink conjunctivae, anicteric sclerae, no xanthelasma, normal dentition, oropharynx clear NECK:  no neck vein engorgement, JVP normal, no hepatojugular reflux, carotid upstroke brisk and symmetric, no bruit, no thyromegaly, no lymphadenopathy LUNGS:  good respiratory effort, clear to auscultation bilaterally CV:  PMI not displaced, no thrills, no lifts, S1 and S2 within normal limits, no palpable S3 or S4, no murmurs, no rubs, no gallops ABD:  Soft, nontender, nondistended, normoactive bowel sounds, no abdominal aortic bruit,  no hepatomegaly, no splenomegaly MS: nontender back, no kyphosis, no scoliosis, no joint deformities EXT:  2+ DP/PT pulses, no edema, no varicosities, no cyanosis, no clubbing SKIN: warm, nondiaphoretic, normal turgor, no ulcers NEUROPSYCH: alert, oriented to person, place, and time, sensory/motor grossly intact, normal mood, appropriate affect   Recent Labs: 06/15/2015: ALT 13; BUN 22; Creatinine, Ser 1.50; Potassium 4.5; Sodium 140   Lipid Panel    Component Value Date/Time   CHOL 113 06/15/2015 0810   CHOL 138 02/25/2013 0357   TRIG 103 06/15/2015 0810   TRIG 134 02/25/2013 0357   HDL 42 06/15/2015 0810   HDL 40 02/25/2013 0357   CHOLHDL 2.7 06/15/2015 0810   VLDL 27 02/25/2013 0357   LDLCALC 50 06/15/2015 0810   LDLCALC 71 02/25/2013 0357     Other studies Reviewed:  EKG:  EKG from 06/13/2015 was personally reviewed by me and revealed sinus bradycardia, sinus arrhythmia, 58 BPM. Left axis deviation. Low voltage QRS. Nonspecific ST abnormality. Unchanged from previous EKG.  EKG from 12/10/2015 was personally reviewed by me and it reveals sinus rhythm, 63 BPM. Low-voltage QRS. LAD/left anterior fascicular block. No significant change from previous EKG 06/13/2015.  Additional studies/ records that were reviewed personally reviewed by me today include:   echocardiogram 02/25/2013: 1. Left ventricular ejection fraction, by visual estimation, is 30 to  35%.  2. Moderately to severely decreased global left ventricular systolic  function.  3. Entire apex, mid and apical anterior wall, mid and apical anterior  septum, and mid anterolateral segment are abnormal.  4. Mildly dilated left atrium.  5. Mild mitral annular dilatation.  6. Mild aortic valve sclerosis without stenosis.   Cardiac catheterization 02/25/2013: Severe 1vessel CAD with occluded large D2 at the ostium, likely culprit for MI. Faint left to left collaterals.   ASSESSMENT AND PLAN:  Coronary  artery disease-status post non-ST segment elevation myocardial infarction due to occlusion of a large second diagonal branch. Overall stable. No angina. Continue medical therapy: Recommend that he can stay on aspirin 81 mg by mouth daily. Consider discontinuing Plavix. In the past, he wanted to stay on both. Continue beta-blockade, ARB, statin therapy.  Chronic systolic congestive heart failure-ejection fraction of 30-35% by echo Patient appears euvolemic. No evidence of acute decompensation. Continue medical therapy. He is on Lasix 20 mg daily. Per EP, patient is not a candidate for ICD.  Hyperlipidemia  PCP following labs. Continue statin therapy. Ideal LDL goal is less than 70.  Hypertension BP is well controlled. Continue monitoring BP. Continue current medical therapy and lifestyle changes. Patient advised to check his pills at home. He should just be on ARB. Consider discontinuingLisinopril if he is still on it.  Current  medicines are reviewed at length with the patient today.  The patient does not have concerns regarding medicines.  Labs/ tests ordered today include:  Orders Placed This Encounter  Procedures  . Basic metabolic panel  . EKG 12-Lead    I had a lengthy and detailed discussion with the patient regarding diagnoses, prognosis, diagnostic options, treatment options, and side effects of medications.   I counseled the patient on importance of lifestyle modification including heart healthy diet, regular physical activity.  Disposition:   FU with Cardiology in 6 months  Signed, Almond Lint, MD  06/10/2016 11:06 AM    [facility]

## 2016-06-10 NOTE — Telephone Encounter (Signed)
No need

## 2016-06-10 NOTE — Patient Instructions (Signed)
Medication Instructions:  Please call us back regarding Lisinopril and Losartan medication.   Labwork: Your physician recommends that you return for lab work in: 1 week for BMP. Go to [facility] of the hospital and check in at the first desk on the right. We will call you with these results.    Follow-Up: Your physician wants you to follow-up in: 6 months. You will receive a reminder letter in the mail two months in advance. If you don't receive a letter, please call our office to schedule the follow-up appointment.  It was a pleasure seeing you today here in the office. Please do not hesitate to give [place] a call back if you have any further questions. [phone]  [place] RN, BSN

## 2016-06-10 NOTE — Telephone Encounter (Signed)
Spoke with patients caregiver per release form and let her know that since he is not currently taking lisinopril then there is no need for him to have those repeat labs. She was appreciative for the call back and will cancel the order for testing.

## 2016-08-22 ENCOUNTER — Other Ambulatory Visit: Payer: Self-pay | Admitting: Cardiovascular Disease

## 2016-10-09 ENCOUNTER — Other Ambulatory Visit: Payer: Self-pay | Admitting: Internal Medicine

## 2016-11-27 ENCOUNTER — Other Ambulatory Visit: Payer: Self-pay

## 2016-11-27 ENCOUNTER — Other Ambulatory Visit: Payer: Self-pay | Admitting: *Deleted

## 2016-11-27 MED ORDER — LOSARTAN POTASSIUM 50 MG PO TABS: ORAL_TABLET | ORAL | 3 refills | Status: DC

## 2016-12-16 NOTE — Progress Notes (Signed)
Cardiology Office Note  Date:  12/18/2016   ID:  Gumesindo, Blancett 30-Jan-1926, MRN [id number]  PCP:  [facility]   Chief Complaint  Patient presents with  . other    6 month f/u. Meds reviewed verbally with pt.    HPI:  Samuel Mcbride is a 83 y.o. 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 He denies chest pain  He has not needed any nitroglycerin sublingual.  No reports of fever, cough, colds, abdominal pain. No syncope. No palpitations.  EKG personally reviewed by myself on todays visit Shows normal sinus rhythm rate 60 bpm right bundle branch block  other past medical history reviewed echocardiogram 02/25/2013: 1. Left ventricular ejection fraction, by visual estimation, is 30 to  35%.  2. Moderately to severely decreased global left ventricular systolic  function.  3. Entire apex, mid and apical anterior wall, mid and apical anterior  septum, and mid anterolateral segment are abnormal.  4. Mildly dilated left atrium.  5. Mild mitral annular dilatation.  6. Mild aortic valve sclerosis without stenosis.   Cardiac catheterization 02/25/2013: Severe 1vessel CAD with occluded large D2 at the ostium, likely culprit for MI. Faint left to left collaterals.    PMH:   has a past medical history of CHF (congestive heart failure) (HCC); Chronic kidney disease, stage III (moderate); Hyperlipidemia; Hypertension; Kidney disease, chronic, stage III (GFR 30-59 ml/min); and MI (myocardial infarction) (HCC).  PSH:    Past Surgical History:  Procedure Laterality Date  .  CARDIAC CATHETERIZATION  12/14   [facility]  . CHOLECYSTECTOMY      Current Outpatient Prescriptions  Medication Sig Dispense Refill  . acetaminophen (TYLENOL) 500 MG tablet Take 500 mg by mouth every 4 (four) hours as needed.    Marland Kitchen aspirin 81 MG tablet Take 1 tablet (81 mg total) by mouth daily. 30 tablet 3  . benzonatate (TESSALON PERLES) 100 MG capsule Take 1 capsule (100 mg total) by mouth 3 (three) times daily as needed for cough (Take 1-2 per dose). 30 capsule 0  . carvedilol (COREG) 6.25 MG tablet Take 1 tablet by mouth in the AM and 1.5 tablet by mouth in the PM daily. *PLEASE KEEP 12/18/16 APPT FOR FURTHER REFILLS* 225 tablet 0  . cetirizine (ZYRTEC) 10 MG tablet Take 10 mg by mouth daily.    . clopidogrel (PLAVIX) 75 MG tablet TAKE 1 TABLET BY MOUTH DAILY WITH BREAKFAST. 90 tablet 1  . docusate sodium (COLACE) 250 MG capsule Take 250 mg by mouth daily as needed for constipation.    . furosemide (LASIX) 40 MG tablet Take 20 mg by mouth daily.    Marland Kitchen losartan (COZAAR) 50 MG tablet TAKE 1 TABLET (50 MG TOTAL) BY MOUTH DAILY. 90 tablet 3  . Multiple Vitamin (MULTIVITAMIN) tablet Take 1 tablet by mouth daily.    . nitroGLYCERIN (NITROSTAT) 0.4 MG SL tablet PLACE 1 TABLET (0.4 MG TOTAL) UNDER THE TONGUE EVERY 5 (FIVE) MINUTES AS NEEDED FOR CHEST PAIN. 25 tablet 0  . simvastatin (ZOCOR) 20 MG tablet TAKE 1 TABLET (20 MG TOTAL) BY MOUTH DAILY.  90 tablet 3   No current facility-administered medications for this visit.      Allergies:   Codeine   Social History:  The patient  reports that he has never smoked. He has never used smokeless tobacco. He reports that he does not drink alcohol or use drugs.   Family History:   family history includes Heart disease in his father.    Review of Systems: Review of Systems  Constitutional: Negative.   Respiratory: Negative.   Cardiovascular: Negative.   Gastrointestinal: Negative.   Musculoskeletal: Negative.        Mild gait instability   Neurological: Negative.   Psychiatric/Behavioral: Negative.   All other systems reviewed and are negative.    PHYSICAL EXAM: VS:  BP 106/62 (BP Location: Left Arm, Patient Position: Sitting, Cuff Size: Normal)   Pulse 60   Ht 6' (1.829 m)   Wt 201 lb 12 oz (91.5 kg)   BMI 27.36 kg/m  , BMI Body mass index is 27.36 kg/m. GEN: Well nourished, well developed, in no acute distress  HEENT: normal  Neck: no JVD, carotid bruits, or masses Cardiac: RRR; no murmurs, rubs, or gallops,no edema  Respiratory:  clear to auscultation bilaterally, normal work of breathing GI: soft, nontender, nondistended, + BS MS: no deformity or atrophy  Skin: warm and dry, no rash Neuro:  Strength and sensation are intact Psych: euthymic mood, full affect    Recent Labs: No results found for requested labs within last 8760 hours.    Lipid Panel Lab Results  Component Value Date   CHOL 113 06/15/2015   HDL 42 06/15/2015   LDLCALC 50 06/15/2015   TRIG 103 06/15/2015      Wt Readings from Last 3 Encounters:  12/18/16 201 lb 12 oz (91.5 kg)  06/10/16 202 lb (91.6 kg)  12/10/15 203 lb 12 oz (92.4 kg)       ASSESSMENT AND PLAN:  Coronary artery disease of native artery of native heart with stable angina pectoris (HCC) - Plan: EKG 12-Lead Currently with no symptoms of angina. No further workup at this time. Continue current medication regimen.  Essential hypertension Blood pressure is well controlled on today's visit. No changes made to the medications.  Other hyperlipidemia Cholesterol is at goal on the current lipid regimen. No changes to the medications were made.  Chronic systolic congestive heart failure (HCC) - Plan: EKG 12-Lead Discussed new treatment options for his cardiomyopathy including entresto Wife present today. They are inclined to leave the medication list alone Concerned about side effects and he feels well  Chronic renal insufficiency, stage II (mild) [place] 1[place] Taking Lasix 20 daily   Total encounter time more than 25 minutes  Greater than 50% was spent in counseling and coordination of care with the patient   Disposition:   F/U  12 months   Orders Placed This Encounter  Procedures  . EKG 12-Lead     Signed, Dossie Arbour, M.D., Ph.D. 12/18/2016  [facility] [place], [place] [phone]

## 2016-12-18 ENCOUNTER — Ambulatory Visit (INDEPENDENT_AMBULATORY_CARE_PROVIDER_SITE_OTHER): Payer: Medicare Other | Admitting: Cardiovascular Disease

## 2016-12-18 ENCOUNTER — Encounter: Payer: Self-pay | Admitting: Cardiovascular Disease

## 2016-12-18 VITALS — BP 106/62 | HR 60 | Ht 72.0 in | Wt 201.8 lb

## 2016-12-18 DIAGNOSIS — I2510 Atherosclerotic heart disease of native coronary artery without angina pectoris: Secondary | ICD-10-CM

## 2016-12-18 DIAGNOSIS — E784 Other hyperlipidemia: Secondary | ICD-10-CM

## 2016-12-18 DIAGNOSIS — N2889 Other specified disorders of kidney and ureter: Secondary | ICD-10-CM

## 2016-12-18 DIAGNOSIS — E7849 Other hyperlipidemia: Secondary | ICD-10-CM

## 2016-12-18 DIAGNOSIS — I5022 Chronic systolic (congestive) heart failure: Secondary | ICD-10-CM | POA: Diagnosis not present

## 2016-12-18 DIAGNOSIS — I209 Angina pectoris, unspecified: Secondary | ICD-10-CM

## 2016-12-18 DIAGNOSIS — N182 Chronic kidney disease, stage 2 (mild): Secondary | ICD-10-CM

## 2016-12-18 DIAGNOSIS — I25118 Atherosclerotic heart disease of native coronary artery with other forms of angina pectoris: Secondary | ICD-10-CM

## 2016-12-18 DIAGNOSIS — I10 Essential (primary) hypertension: Secondary | ICD-10-CM | POA: Diagnosis not present

## 2016-12-18 NOTE — Patient Instructions (Signed)

## 2017-01-07 ENCOUNTER — Other Ambulatory Visit: Payer: Self-pay | Admitting: Cardiovascular Disease

## 2017-02-15 ENCOUNTER — Other Ambulatory Visit: Payer: Self-pay | Admitting: Cardiovascular Disease

## 2017-02-25 ENCOUNTER — Other Ambulatory Visit: Payer: Self-pay | Admitting: Cardiology

## 2017-02-25 MED ORDER — SIMVASTATIN 20 MG PO TABS: 20.0000 mg | ORAL_TABLET | Freq: Every day | ORAL | 2 refills | Status: DC

## 2017-03-24 DIAGNOSIS — K4090 Unilateral inguinal hernia, without obstruction or gangrene, not specified as recurrent: Secondary | ICD-10-CM

## 2017-03-24 HISTORY — DX: Unilateral inguinal hernia, without obstruction or gangrene, not specified as recurrent: K40.90

## 2017-04-07 ENCOUNTER — Telehealth: Payer: Self-pay | Admitting: Cardiovascular Disease

## 2017-04-07 ENCOUNTER — Other Ambulatory Visit: Payer: Self-pay

## 2017-04-07 MED ORDER — SIMVASTATIN 20 MG PO TABS: 20.0000 mg | ORAL_TABLET | Freq: Every day | ORAL | 2 refills | Status: DC

## 2017-04-07 MED ORDER — CARVEDILOL 6.25 MG PO TABS: ORAL_TABLET | ORAL | 3 refills | Status: DC

## 2017-04-07 MED ORDER — LOSARTAN POTASSIUM 50 MG PO TABS: ORAL_TABLET | ORAL | 3 refills | Status: DC

## 2017-04-07 MED ORDER — FUROSEMIDE 20 MG PO TABS: 20.0000 mg | ORAL_TABLET | Freq: Every day | ORAL | 0 refills | Status: DC

## 2017-04-07 NOTE — Telephone Encounter (Signed)
Patient wants all meds sent to [facility]

## 2017-04-07 NOTE — Telephone Encounter (Signed)
Requested Prescriptions   Signed Prescriptions Disp Refills  . furosemide (LASIX) 20 MG tablet 90 tablet 0    Sig: Take 1 tablet (20 mg total) by mouth daily.    Authorizing Provider: GOLLAN, TIMOTHY J    Ordering User: Toluwani Yadav N  . carvedilol (COREG) 6.25 MG tablet 225 tablet 3    Sig: TAKE 1 TABLET IN THE MORNING AND 1 1/2 IN THE EVENING    Authorizing Provider: GOLLAN, TIMOTHY J    Ordering User: Trystin Hargrove N  . losartan (COZAAR) 50 MG tablet 90 tablet 3    Sig: TAKE 1 TABLET (50 MG TOTAL) BY MOUTH DAILY.    Authorizing Provider: GOLLAN, TIMOTHY J    Ordering User: Han Vejar N  . simvastatin (ZOCOR) 20 MG tablet 90 tablet 2    Sig: Take 1 tablet (20 mg total) by mouth daily.    Authorizing Provider: GOLLAN, TIMOTHY J    Ordering User: Kalayla Shadden N    

## 2017-04-07 NOTE — Telephone Encounter (Signed)
Patient says he is running low on everything   *STAT* If patient is at the pharmacy, call can be transferred to refill team.   1. Which medications need to be refilled? (please list name of each medication and dose if known)      ALL MEDICATIONS  2. Which pharmacy/location (including street and city if local pharmacy) is medication to be sent to? [facility]   3. Do they need a 30 day or 90 day supply? 90

## 2017-04-07 NOTE — Telephone Encounter (Signed)
Requested Prescriptions   Signed Prescriptions Disp Refills  . furosemide (LASIX) 20 MG tablet 90 tablet 0    Sig: Take 1 tablet (20 mg total) by mouth daily.    Authorizing Provider: Antonieta Iba    Ordering User: Margrett Rud carvedilol (COREG) 6.25 MG tablet 225 tablet 3    Sig: TAKE 1 TABLET IN THE MORNING AND 1 1/2 IN THE EVENING    Authorizing Provider: Antonieta Iba    Ordering User: Margrett Rud losartan (COZAAR) 50 MG tablet 90 tablet 3    Sig: TAKE 1 TABLET (50 MG TOTAL) BY MOUTH DAILY.    Authorizing Provider: Antonieta Iba    Ordering User: Margrett Rud simvastatin (ZOCOR) 20 MG tablet 90 tablet 2    Sig: Take 1 tablet (20 mg total) by mouth daily.    Authorizing Provider: Antonieta Iba    Ordering User: Margrett Rud

## 2017-06-03 ENCOUNTER — Other Ambulatory Visit: Payer: Self-pay | Admitting: *Deleted

## 2017-06-03 MED ORDER — FUROSEMIDE 20 MG PO TABS: 20.0000 mg | ORAL_TABLET | Freq: Every day | ORAL | 3 refills | Status: DC

## 2017-06-04 ENCOUNTER — Other Ambulatory Visit: Payer: Self-pay | Admitting: *Deleted

## 2017-06-04 MED ORDER — FUROSEMIDE 20 MG PO TABS: 20.0000 mg | ORAL_TABLET | Freq: Every day | ORAL | 3 refills | Status: DC

## 2017-12-20 NOTE — Progress Notes (Signed)
Cardiology Office Note  Date:  12/21/2017   ID:  Ashaun, Killion 1926-01-29, MRN [id number]  PCP:  [facility]   Chief Complaint  Patient presents with  . other    12 month follow up. Meds reviewed by the pt. verbally. "doing well."     HPI:  ASMAR CERTO is a 84 y.o. male with PMH of CAD , cath 2014, occluded large D2 at the ostium, CHF CRI, stage III  CR 1.5 Hyperlipidemia ejection fraction of 30-35% by echo 07/06/2014 Who presents for follow up of his CAD  Reports he is doing well Active at home, like to race cars Blood pressure running low but is asymptomatic  No regular exercise program No PND, orthopnea, edema. He denies chest pain  No near syncope or syncope  Lab work reviewed glucose 100 total cholesterol 109 LDL 47 March 2019  EKG personally reviewed by myself on todays visit Shows normal sinus rhythm rate 68 bpm right bundle branch block No change in EKG compared to 2018  other past medical history reviewed echocardiogram 02/25/2013: 1. Left ventricular ejection fraction, by visual estimation, is 30 to  35%.  2. Moderately to severely decreased global left ventricular systolic  function.  3. Entire apex, mid and apical anterior wall, mid and apical anterior  septum, and mid anterolateral segment are abnormal.  4. Mildly dilated left atrium.  5. Mild mitral annular dilatation.  6. Mild aortic valve sclerosis without stenosis.   Cardiac catheterization 02/25/2013: Severe 1vessel CAD with occluded large D2 at the ostium, likely culprit for MI. Faint left to left collaterals.    PMH:   has a past medical history of CHF (congestive heart failure) (HCC), Chronic kidney disease, stage III (moderate) (HCC), Hyperlipidemia, Hypertension, Kidney disease, chronic, stage III (GFR 30-59 ml/min) (HCC), and MI (myocardial infarction) (HCC).  PSH:    Past Surgical History:  Procedure Laterality Date  . CARDIAC  CATHETERIZATION  12/14   [facility]  . CHOLECYSTECTOMY      Current Outpatient Medications  Medication Sig Dispense Refill  . acetaminophen (TYLENOL) 500 MG tablet Take 500 mg by mouth every 4 (four) hours as needed.    Marland Kitchen aspirin 81 MG tablet Take 1 tablet (81 mg total) by mouth daily. 30 tablet 3  . benzonatate (TESSALON PERLES) 100 MG capsule Take 1 capsule (100 mg total) by mouth 3 (three) times daily as needed for cough (Take 1-2 per dose). 30 capsule 0  . carvedilol (COREG) 6.25 MG tablet TAKE 1 TABLET IN THE MORNING AND 1 1/2 IN THE EVENING 225 tablet 3  . cetirizine (ZYRTEC) 10 MG tablet Take 10 mg by mouth daily.    . clopidogrel (PLAVIX) 75 MG tablet TAKE 1 TABLET BY MOUTH DAILY WITH BREAKFAST. 90 tablet 1  . docusate sodium (COLACE) 250 MG capsule Take 250 mg by mouth daily as needed for constipation.    . furosemide (LASIX) 20 MG tablet Take 1 tablet (20 mg total) by mouth daily. 90 tablet 3  . losartan (COZAAR) 50 MG tablet TAKE 1 TABLET (50 MG TOTAL) BY MOUTH DAILY. 90 tablet 3  . Multiple Vitamin (MULTIVITAMIN) tablet Take 1 tablet by mouth daily.    . nitroGLYCERIN (NITROSTAT) 0.4 MG SL tablet PLACE 1 TABLET (0.4 MG TOTAL) UNDER THE TONGUE EVERY 5 (FIVE) MINUTES AS NEEDED FOR CHEST PAIN. 25 tablet 0  . simvastatin (ZOCOR) 20 MG tablet Take 1 tablet (20 mg total) by mouth daily. 90 tablet 2  No current facility-administered medications for this visit.      Allergies:   Codeine   Social History:  The patient  reports that he has never smoked. He has never used smokeless tobacco. He reports that he does not drink alcohol or use drugs.   Family History:   family history includes Heart disease in his father.    Review of Systems: Review of Systems  Constitutional: Negative.   Respiratory: Negative.   Cardiovascular: Negative.   Gastrointestinal: Negative.   Musculoskeletal: Negative.        Mild gait instability  Neurological: Negative.   Psychiatric/Behavioral: Negative.    All other systems reviewed and are negative.    PHYSICAL EXAM: VS:  BP 104/60 (BP Location: Left Arm, Patient Position: Sitting, Cuff Size: Normal)   Pulse 68   Ht 6' (1.829 m)   Wt 201 lb 12 oz (91.5 kg)   BMI 27.36 kg/m  , BMI Body mass index is 27.36 kg/m. Constitutional:  oriented to person, place, and time. No distress.  HENT:  Head: Normocephalic and atraumatic.  Eyes:  no discharge. No scleral icterus.  Neck: Normal range of motion. Neck supple. No JVD present.  Cardiovascular: Normal rate, regular rhythm, normal heart sounds and intact distal pulses. Exam reveals no gallop and no friction rub. No edema No murmur heard. Pulmonary/Chest: Effort normal and breath sounds normal. No stridor. No respiratory distress.  no wheezes.  no rales.  no tenderness.  Abdominal: Soft.  no distension.  no tenderness.  Musculoskeletal: Normal range of motion.  no  tenderness or deformity.  Neurological:  normal muscle tone. Coordination normal. No atrophy Skin: Skin is warm and dry. No rash noted. not diaphoretic.  Psychiatric:  normal mood and affect. behavior is normal. Thought content normal.    Recent Labs: No results found for requested labs within last 8760 hours.    Lipid Panel Lab Results  Component Value Date   CHOL 113 06/15/2015   HDL 42 06/15/2015   LDLCALC 50 06/15/2015   TRIG 103 06/15/2015      Wt Readings from Last 3 Encounters:  12/21/17 201 lb 12 oz (91.5 kg)  12/18/16 201 lb 12 oz (91.5 kg)  06/10/16 202 lb (91.6 kg)       ASSESSMENT AND PLAN:  Coronary artery disease of native artery of native heart with stable angina pectoris (HCC) - Plan: EKG 12-Lead Currently with no symptoms of angina. No further workup at this time. Continue current medication regimen.  Stable  Essential hypertension Blood pressure running low Long discussion with him concerning various medication changes we could make Wife will check orthostatics at home We will decrease  carvedilol down to 6.25 mg twice daily If he is orthostatic we might need to decrease down to 3.125 mg twice daily  Phlegm, spitting up Uncertain if this is allergies Scattered Rales on exam He reports primary care did x-ray which was unrevealing Recommend he try albuterol inhaler morning and evening to see if this helps with clearing out his secretions Recommended he try wearing a mask when he is in dusty environments At home  Other hyperlipidemia Cholesterol is at goal on the current lipid regimen. No changes to the medications were made.  Stable  Chronic systolic congestive heart failure (HCC) - Plan: EKG 12-Lead Continue current medications We previously discussed other options for medications including entresto They declined on the last clinic visit  Chronic renal [place], stage II (mild) [place] 1[place]  Taking Lasix 20 daily   Total encounter time more than 25 minutes  Greater than 50% was spent in counseling and coordination of care with the patient   Disposition:   F/U  12 months   No orders of the defined types were placed in this encounter.    Signed, Dossie Arbour, M.D., Ph.D. 12/21/2017  [facility] [place], [place] [phone]

## 2017-12-21 ENCOUNTER — Encounter: Payer: Self-pay | Admitting: Cardiovascular Disease

## 2017-12-21 ENCOUNTER — Ambulatory Visit (INDEPENDENT_AMBULATORY_CARE_PROVIDER_SITE_OTHER): Payer: Medicare Other | Admitting: Cardiovascular Disease

## 2017-12-21 VITALS — BP 104/60 | HR 68 | Ht 72.0 in | Wt 201.8 lb

## 2017-12-21 DIAGNOSIS — N182 Chronic kidney disease, stage 2 (mild): Secondary | ICD-10-CM | POA: Diagnosis not present

## 2017-12-21 DIAGNOSIS — I10 Essential (primary) hypertension: Secondary | ICD-10-CM | POA: Diagnosis not present

## 2017-12-21 DIAGNOSIS — I5022 Chronic systolic (congestive) heart failure: Secondary | ICD-10-CM | POA: Diagnosis not present

## 2017-12-21 DIAGNOSIS — E7849 Other hyperlipidemia: Secondary | ICD-10-CM

## 2017-12-21 DIAGNOSIS — I25118 Atherosclerotic heart disease of native coronary artery with other forms of angina pectoris: Secondary | ICD-10-CM | POA: Diagnosis not present

## 2017-12-21 MED ORDER — NITROGLYCERIN 0.4 MG SL SUBL: 0.4000 mg | SUBLINGUAL_TABLET | SUBLINGUAL | 0 refills | Status: DC | PRN

## 2017-12-21 MED ORDER — CARVEDILOL 6.25 MG PO TABS: ORAL_TABLET | ORAL | 3 refills | Status: DC

## 2017-12-21 MED ORDER — LOSARTAN POTASSIUM 50 MG PO TABS: ORAL_TABLET | ORAL | 3 refills | Status: DC

## 2017-12-21 MED ORDER — SIMVASTATIN 20 MG PO TABS: 20.0000 mg | ORAL_TABLET | Freq: Every day | ORAL | 3 refills | Status: DC

## 2017-12-21 MED ORDER — CLOPIDOGREL BISULFATE 75 MG PO TABS: 75.0000 mg | ORAL_TABLET | Freq: Every day | ORAL | 3 refills | Status: DC

## 2017-12-21 MED ORDER — FUROSEMIDE 20 MG PO TABS: 20.0000 mg | ORAL_TABLET | Freq: Every day | ORAL | 3 refills | Status: DC

## 2017-12-21 MED ORDER — ALBUTEROL SULFATE HFA 108 (90 BASE) MCG/ACT IN AERS: 1.0000 | INHALATION_SPRAY | Freq: Four times a day (QID) | RESPIRATORY_TRACT | 1 refills | Status: AC | PRN

## 2017-12-21 NOTE — Patient Instructions (Addendum)
Medication Instructions:   Decrease the coreg down to one pill twice a day  Albuterol inhaler 1-2 puffs q6 as needed  Labwork:  No new labs needed  Testing/Procedures:  No further testing at this time   Follow-Up: It was a pleasure seeing you in the office today. Please call us if you have new issues that need to be addressed before your next appt.  [phone]  Your physician wants you to follow-up in: 12 months.  You will receive a reminder letter in the mail two months in advance. If you don't receive a letter, please call our office to schedule the follow-up appointment.  If you need a refill on your cardiac medications before your next appointment, please call your pharmacy.  For educational health videos Log in to : [email or web address] Or : [email or web address], password : [email or web address]

## 2017-12-23 ENCOUNTER — Encounter: Payer: Self-pay | Admitting: Emergency Medicine

## 2017-12-23 ENCOUNTER — Emergency Department: Payer: Medicare Other

## 2017-12-23 ENCOUNTER — Emergency Department
Admission: EM | Admit: 2017-12-23 | Discharge: 2017-12-23 | Disposition: A | Discharge status: Home / Self Care | Payer: Medicare Other | Attending: Emergency Medicine | Admitting: Emergency Medicine

## 2017-12-23 ENCOUNTER — Ambulatory Visit (INDEPENDENT_AMBULATORY_CARE_PROVIDER_SITE_OTHER)
Admission: EM | Admit: 2017-12-23 | Discharge: 2017-12-23 | Disposition: A | Discharge status: Other Acute Inpatient Hospital | Payer: Medicare Other | Source: Home / Self Care | Attending: Family Medicine | Admitting: Family Medicine

## 2017-12-23 ENCOUNTER — Other Ambulatory Visit: Payer: Self-pay

## 2017-12-23 DIAGNOSIS — Z79899 Other long term (current) drug therapy: Secondary | ICD-10-CM | POA: Diagnosis not present

## 2017-12-23 DIAGNOSIS — Z7982 Long term (current) use of aspirin: Secondary | ICD-10-CM | POA: Diagnosis not present

## 2017-12-23 DIAGNOSIS — R109 Unspecified abdominal pain: Secondary | ICD-10-CM

## 2017-12-23 DIAGNOSIS — I5022 Chronic systolic (congestive) heart failure: Secondary | ICD-10-CM | POA: Insufficient documentation

## 2017-12-23 DIAGNOSIS — K4090 Unilateral inguinal hernia, without obstruction or gangrene, not specified as recurrent: Secondary | ICD-10-CM

## 2017-12-23 DIAGNOSIS — N183 Chronic kidney disease, stage 3 (moderate): Secondary | ICD-10-CM | POA: Diagnosis not present

## 2017-12-23 DIAGNOSIS — I130 Hypertensive heart and chronic kidney disease with heart failure and stage 1 through stage 4 chronic kidney disease, or unspecified chronic kidney disease: Secondary | ICD-10-CM | POA: Diagnosis not present

## 2017-12-23 DIAGNOSIS — K4030 Unilateral inguinal hernia, with obstruction, without gangrene, not specified as recurrent: Secondary | ICD-10-CM

## 2017-12-23 LAB — BASIC METABOLIC PANEL
ANION GAP: 7 (ref 5–15)
BUN: 22 mg/dL (ref 8–23)
CALCIUM: 8.4 mg/dL — AB (ref 8.9–10.3)
CO2: 26 mmol/L (ref 22–32)
Chloride: 103 mmol/L (ref 98–111)
Creatinine, Ser: 1.98 mg/dL — ABNORMAL HIGH (ref 0.61–1.24)
GFR calc non Af Amer: 28 mL/min — ABNORMAL LOW (ref >60)
GFR, EST AFRICAN AMERICAN: 33 mL/min — AB (ref >60)
GLUCOSE: 109 mg/dL — AB (ref 70–99)
POTASSIUM: 3.9 mmol/L (ref 3.5–5.1)
SODIUM: 136 mmol/L (ref 135–145)

## 2017-12-23 LAB — CBC WITH DIFFERENTIAL/PLATELET
BASOS ABS: 0 10*3/uL (ref 0–0.1)
BASOS PCT: 0 %
EOS ABS: 0.2 10*3/uL (ref 0–0.7)
EOS PCT: 3 %
HCT: 41.5 % (ref 40.0–52.0)
Hemoglobin: 14.4 g/dL (ref 13.0–18.0)
LYMPHS PCT: 25 %
Lymphs Abs: 1.7 10*3/uL (ref 1.0–3.6)
MCH: 33.9 pg (ref 26.0–34.0)
MCHC: 34.7 g/dL (ref 32.0–36.0)
MCV: 97.8 fL (ref 80.0–100.0)
MONO ABS: 1.1 10*3/uL — AB (ref 0.2–1.0)
Monocytes Relative: 16 %
Neutro Abs: 3.7 10*3/uL (ref 1.4–6.5)
Neutrophils Relative %: 56 %
PLATELETS: 197 10*3/uL (ref 150–440)
RBC: 4.24 MIL/uL — AB (ref 4.40–5.90)
RDW: 13.7 % (ref 11.5–14.5)
WBC: 6.7 10*3/uL (ref 3.8–10.6)

## 2017-12-23 LAB — LACTIC ACID, PLASMA: Lactic Acid, Venous: 1 mmol/L (ref 0.5–1.9)

## 2017-12-23 NOTE — ED Provider Notes (Signed)
[facility]    CSN: [id number] Arrival date & time: 12/23/17  1830     History   Chief Complaint Chief Complaint  Patient presents with  . Hernia    HPI Samuel Mcbride is a 84 y.o. male.   84 yo male with a c/o progressively worsening lump on the left groin for the last 4 days. Patient states lump has been getting larger and more painful. Denies any fevers, chills, injuries.   The history is provided by the patient.    Past Medical History:  Diagnosis Date  . CHF (congestive heart failure) (HCC)   . Chronic kidney disease, stage III (moderate) (HCC)   . Hyperlipidemia   . Hypertension   . Kidney disease, chronic, stage III (GFR 30-59 ml/min) (HCC)   . MI (myocardial infarction) [facility])     Patient Active Problem List   Diagnosis Date Noted  . CAD (coronary artery disease) 03/01/2013  . Hyperlipidemia 03/01/2013  . Essential hypertension 03/01/2013  . Chronic renal insufficiency, stage II (mild) 03/01/2013  . Chronic systolic congestive heart failure (HCC) 03/01/2013    Past Surgical History:  Procedure Laterality Date  . CARDIAC CATHETERIZATION  12/14   [facility]  . CHOLECYSTECTOMY         Home Medications    Prior to Admission medications   Medication Sig Start Date End Date Taking? Authorizing Provider  acetaminophen (TYLENOL) 500 MG tablet Take 500 mg by mouth every 4 (four) hours as needed.   Yes [provider]  albuterol (PROVENTIL HFA;VENTOLIN HFA) 108 (90 Base) MCG/ACT inhaler Inhale 1-2 puffs into the lungs every 6 (six) hours as needed for wheezing or shortness of breath. 12/21/17  Yes Antonieta Iba, MD  carvedilol (COREG) 6.25 MG tablet TAKE 1 TABLET twice a day 12/21/17  Yes Gollan, Tollie Pizza, MD  cetirizine (ZYRTEC) 10 MG tablet Take 10 mg by mouth daily.   Yes [provider]  clopidogrel (PLAVIX) 75 MG tablet Take 1 tablet (75 mg total) by mouth daily with breakfast. 12/21/17  Yes Gollan, Tollie Pizza, MD  docusate  sodium (COLACE) 250 MG capsule Take 250 mg by mouth daily as needed for constipation.   Yes [provider]  furosemide (LASIX) 20 MG tablet Take 1 tablet (20 mg total) by mouth daily. 12/21/17  Yes Gollan, Tollie Pizza, MD  losartan (COZAAR) 50 MG tablet TAKE 1 TABLET (50 MG TOTAL) BY MOUTH DAILY. 12/21/17  Yes Antonieta Iba, MD  Multiple Vitamin (MULTIVITAMIN) tablet Take 1 tablet by mouth daily.   Yes [provider]  nitroGLYCERIN (NITROSTAT) 0.4 MG SL tablet Place 1 tablet (0.4 mg total) under the tongue every 5 (five) minutes as needed for chest pain. 12/21/17  Yes Antonieta Iba, MD  simvastatin (ZOCOR) 20 MG tablet Take 1 tablet (20 mg total) by mouth daily. 12/21/17  Yes Antonieta Iba, MD  aspirin 81 MG tablet Take 1 tablet (81 mg total) by mouth daily. 04/01/13   Nahser, Deloris Ping, MD  benzonatate (TESSALON PERLES) 100 MG capsule Take 1 capsule (100 mg total) by mouth 3 (three) times daily as needed for cough (Take 1-2 per dose). 08/22/14   Menshew, Charlesetta Ivory, PA-C    Family History Family History  Problem Relation Age of Onset  . Heart disease Father     Social History Social History   Tobacco Use  . Smoking status: Never Smoker  . Smokeless tobacco: Never Used  Substance Use Topics  .  Alcohol use: No  . Drug use: No     Allergies   Codeine   Review of Systems Review of Systems   Physical Exam Triage Vital Signs ED Triage Vitals  Enc Vitals Group     BP 12/23/17 1843 117/82     Pulse Rate 12/23/17 1843 96     Resp 12/23/17 1843 18     Temp 12/23/17 1843 98 F (36.7 C)     Temp Source 12/23/17 1843 Oral     SpO2 12/23/17 1843 93 %     Weight 12/23/17 1841 201 lb 12 oz (91.5 kg)     Height 12/23/17 1841 6' (1.829 m)     Head Circumference --      Peak Flow --      Pain Score 12/23/17 1841 6     Pain Loc --      Pain Edu? --      Excl. in GC? --    No data found.  Updated Vital Signs BP 117/82 (BP Location: Left Arm)   Pulse  96   Temp 98 F (36.7 C) (Oral)   Resp 18   Ht 6' (1.829 m)   Wt 91.5 kg   SpO2 93%   BMI 27.36 kg/m   Visual Acuity Right Eye Distance:   Left Eye Distance:   Bilateral Distance:    Right Eye Near:   Left Eye Near:    Bilateral Near:     Physical Exam  Constitutional: He appears well-developed and well-nourished. No distress.  Abdominal: A hernia is present. Hernia confirmed positive in the left inguinal area (large, non-reducible, tender).  Skin: He is not diaphoretic.  Nursing note and vitals reviewed.    UC Treatments / Results  Labs (all labs ordered are listed, but only abnormal results are displayed) Labs Reviewed - No data to display  EKG None  Radiology No results found.  Procedures Procedures (including critical care time)  Medications Ordered in UC Medications - No data to display  Initial Impression / Assessment and Plan / UC Course  I have reviewed the triage vital signs and the nursing notes.  Pertinent labs & imaging results that were available during my care of the patient were reviewed by me and considered in my medical decision making (see chart for details).      Final Clinical Impressions(s) / UC Diagnoses   Final diagnoses:  Incarcerated left inguinal hernia     Discharge Instructions     Recommend patient go to Emergency Department for further evaluation and management    ED Prescriptions    None     1. diagnosis reviewed with patient and wife; recommend patient go to emergency department for further evaluation and management  Controlled Substance Prescriptions [place] Controlled Substance Registry consulted? Not Applicable   Payton Mccallum, MD 12/23/17 2023

## 2017-12-23 NOTE — Discharge Instructions (Addendum)
Please seek medical attention for any high fevers, chest pain, shortness of breath, change in behavior, persistent vomiting, bloody stool or any other new or concerning symptoms.  

## 2017-12-23 NOTE — ED Triage Notes (Signed)
Pt presents to ED after he was instructed to come to ED by [facility] urgent care due to "strangulated hernia" after they attempted to "put it back in place unsuccessfully". Pt states this hernia has been bothering him since Saturday. Pt reports pain.

## 2017-12-23 NOTE — Discharge Instructions (Addendum)
Recommend patient go to Emergency Department for further evaluation and management °

## 2017-12-23 NOTE — ED Provider Notes (Signed)
[facility] Emergency Department Provider Note   ____________________________________________   I have reviewed the triage vital signs and the nursing notes.   HISTORY  Chief Complaint Inguinal Hernia   History limited by: Not Limited   HPI SEVAG SHEARN is a 84 y.o. male who presents to the emergency department today because of concern for incarcerated hernia. Patient states that he first started noticing a bulge to his left suprapubic area about 4 days ago.  It is continued to enlarge and now is burning.  The patient states that he last had a bowel movement this morning but denies passing gas today.  The patient denies having similar symptoms in the past.  Denies any nausea or vomiting.    Per medical record review patient has a history of chf, HLD, HTN, MI.  Past Medical History:  Diagnosis Date  . CHF (congestive heart failure) (HCC)   . Chronic kidney disease, stage III (moderate) (HCC)   . Hyperlipidemia   . Hypertension   . Kidney disease, chronic, stage III (GFR 30-59 ml/min) (HCC)   . MI (myocardial infarction) [facility])     Patient Active Problem List   Diagnosis Date Noted  . CAD (coronary artery disease) 03/01/2013  . Hyperlipidemia 03/01/2013  . Essential hypertension 03/01/2013  . Chronic renal insufficiency, stage II (mild) 03/01/2013  . Chronic systolic congestive heart failure (HCC) 03/01/2013    Past Surgical History:  Procedure Laterality Date  . CARDIAC CATHETERIZATION  12/14   [facility]  . CHOLECYSTECTOMY      Prior to Admission medications   Medication Sig Start Date End Date Taking? Authorizing Provider  acetaminophen (TYLENOL) 500 MG tablet Take 500 mg by mouth every 4 (four) hours as needed.    [provider]  albuterol (PROVENTIL HFA;VENTOLIN HFA) 108 (90 Base) MCG/ACT inhaler Inhale 1-2 puffs into the lungs every 6 (six) hours as needed for wheezing or shortness of breath. 12/21/17   Antonieta Iba, MD   aspirin 81 MG tablet Take 1 tablet (81 mg total) by mouth daily. 04/01/13   Nahser, Deloris Ping, MD  benzonatate (TESSALON PERLES) 100 MG capsule Take 1 capsule (100 mg total) by mouth 3 (three) times daily as needed for cough (Take 1-2 per dose). 08/22/14   Menshew, Charlesetta Ivory, PA-C  carvedilol (COREG) 6.25 MG tablet TAKE 1 TABLET twice a day 12/21/17   Antonieta Iba, MD  cetirizine (ZYRTEC) 10 MG tablet Take 10 mg by mouth daily.    [provider]  clopidogrel (PLAVIX) 75 MG tablet Take 1 tablet (75 mg total) by mouth daily with breakfast. 12/21/17   Antonieta Iba, MD  docusate sodium (COLACE) 250 MG capsule Take 250 mg by mouth daily as needed for constipation.    [provider]  furosemide (LASIX) 20 MG tablet Take 1 tablet (20 mg total) by mouth daily. 12/21/17   Antonieta Iba, MD  losartan (COZAAR) 50 MG tablet TAKE 1 TABLET (50 MG TOTAL) BY MOUTH DAILY. 12/21/17   Antonieta Iba, MD  Multiple Vitamin (MULTIVITAMIN) tablet Take 1 tablet by mouth daily.    [provider]  nitroGLYCERIN (NITROSTAT) 0.4 MG SL tablet Place 1 tablet (0.4 mg total) under the tongue every 5 (five) minutes as needed for chest pain. 12/21/17   Antonieta Iba, MD  simvastatin (ZOCOR) 20 MG tablet Take 1 tablet (20 mg total) by mouth daily. 12/21/17   Antonieta Iba, MD    Allergies Codeine  Family History  Problem Relation Age of Onset  . Heart disease Father     Social History Social History   Tobacco Use  . Smoking status: Never Smoker  . Smokeless tobacco: Never Used  Substance Use Topics  . Alcohol use: No  . Drug use: No    Review of Systems Constitutional: No fever/chills Eyes: No visual changes. ENT: No sore throat. Cardiovascular: Denies chest pain. Respiratory: Denies shortness of breath. Gastrointestinal: Positive for lower abdominal pain and swelling.  Genitourinary: Negative for dysuria. Musculoskeletal: Negative for back pain. Skin:  Negative for rash. Neurological: Negative for headaches, focal weakness or numbness.  ____________________________________________   PHYSICAL EXAM:  VITAL SIGNS: ED Triage Vitals  Enc Vitals Group     BP 12/23/17 1941 130/68     Pulse Rate 12/23/17 1941 97     Resp 12/23/17 1941 18     Temp 12/23/17 1941 97.8 F (36.6 C)     Temp Source 12/23/17 1941 Oral     SpO2 12/23/17 1941 93 %     Weight 12/23/17 1942 200 lb (90.7 kg)     Height 12/23/17 1942 6' (1.829 m)     Head Circumference --      Peak Flow --      Pain Score 12/23/17 1954 8   Constitutional: Alert and oriented.  Eyes: Conjunctivae are normal.  ENT      Head: Normocephalic and atraumatic.      Nose: No congestion/rhinnorhea.      Mouth/Throat: Mucous membranes are moist.      Neck: No stridor. Hematological/Lymphatic/Immunilogical: No cervical lymphadenopathy. Cardiovascular: Normal rate, regular rhythm.  No murmurs, rubs, or gallops.  Respiratory: Normal respiratory effort without tachypnea nor retractions. Breath sounds are clear and equal bilaterally. No wheezes/rales/rhonchi. Gastrointestinal: Left inguinal hernia. No overlying skin change. Tender to palpation.  Genitourinary: Deferred Musculoskeletal: Normal range of motion in all extremities. No lower extremity edema. Neurologic:  Normal speech and language. No gross focal neurologic deficits are appreciated.  Skin:  Skin is warm, dry and intact. No rash noted. Psychiatric: Mood and affect are normal. Speech and behavior are normal. Patient exhibits appropriate insight and judgment.  ____________________________________________    LABS (pertinent positives/negatives)  Lactic 1.0 CBC wbc 6.7, hgb 14.4, plt 197 BMP na 136, k 3.9, cr 1.98  ____________________________________________   EKG  None  ____________________________________________    RADIOLOGY  Abd x-ray No acute findings  CT abd/pel Fat containing left inguinal hernia.    ____________________________________________   PROCEDURES  Procedures  ____________________________________________   INITIAL IMPRESSION / ASSESSMENT AND PLAN / ED COURSE  Pertinent labs & imaging results that were available during my care of the patient were reviewed by me and considered in my medical decision making (see chart for details).   Patient presented to the emergency department today because of concerns for left inguinal pain and swelling.  On exam he does have obvious inguinal hernia on that left side.  No overlying skin changes.  Work-up was initiated with abdominal x-ray which did not show any obvious signs for obstruction.  CT scan shows a fat-containing inguinal hernia.  Discussed with Dr. Earlene Plater with surgery.  Given that the patient's pain did resolve during his time here in the emergency department plan will be for patient follow-up as an outpatient.  Discussed findings plan with patient.  Answered all questions.  Discussed return precautions.   ____________________________________________   FINAL CLINICAL IMPRESSION(S) / ED DIAGNOSES  Final diagnoses:  Abdominal pain  Inguinal hernia, left     Note: This dictation was prepared with Dragon dictation. Any transcriptional errors that result from this process are unintentional     Phineas Semen, MD 12/23/17 2327

## 2017-12-23 NOTE — ED Triage Notes (Signed)
Patient complains of hernia that he noticed on Saturday that is worse on Saturday. Patient states that pain has been worsening.

## 2017-12-24 ENCOUNTER — Ambulatory Visit (INDEPENDENT_AMBULATORY_CARE_PROVIDER_SITE_OTHER): Payer: Medicare Other | Admitting: General Surgery

## 2017-12-24 ENCOUNTER — Encounter: Payer: Self-pay | Admitting: General Surgery

## 2017-12-24 VITALS — BP 124/77 | HR 78 | Resp 14 | Ht 72.0 in | Wt 201.4 lb

## 2017-12-24 DIAGNOSIS — K4030 Unilateral inguinal hernia, with obstruction, without gangrene, not specified as recurrent: Secondary | ICD-10-CM

## 2017-12-24 DIAGNOSIS — I25118 Atherosclerotic heart disease of native coronary artery with other forms of angina pectoris: Secondary | ICD-10-CM | POA: Diagnosis not present

## 2017-12-24 NOTE — Patient Instructions (Addendum)
Use 1 capful of Miralax in a full glass of water daily. Apply ice/cold to the hernia to help with the swelling.   You will need to stop your Plavix prior to surgery. Stop Now.    Laparoscopic Inguinal Hernia Repair, Adult Laparoscopic inguinal hernia repair is a surgical procedure to repair a small weak spot in the groin muscles that allows fat or intestine from inside the abdomen to bulge out (inguinal hernia). This procedure may be planned, or it may be an emergency procedure. During the procedure, tissue that has bulged out is moved back into place, and the opening in the groin muscles is repaired. This is done through three small incisions in the abdomen. A thin tube with a light and camera on the end (laparoscope) will be used to help perform the procedure. Tell a health care provider about:  Any allergies you have.  All medicines you are taking, including vitamins, herbs, eye drops, creams, and over-the-counter medicines.  Any problems you or family members have had with anesthetic medicines.  Any blood disorders you have.  Any surgeries you have had.  Any medical conditions you have.  Whether you are pregnant or may be pregnant. What are the risks? Generally, this is a safe procedure. However, problems may occur, including:  Infection.  Bleeding.  Allergic reactions to medicines.  Damage to other structures or organs.  Long-term pain and swelling of the scrotum, in men.  Testicle damage in men.  Inability to completely empty the bladder (urinary retention).  A collection of fluid that builds up under the skin (seroma).  The hernia coming back (recurrence).  What happens before the procedure? Medicines  Ask your health care provider about: ? Changing or stopping your regular medicines. This is especially important if you are taking diabetes medicines or blood thinners. ? Taking over-the-counter medicines, vitamins, herbs, and supplements. ? Taking medicines  such as aspirin and ibuprofen. These medicines can thin your blood. Do not take these medicines unless your health care provider tells you to take them.  You may be given antibiotic medicine to help prevent an infection. Staying hydrated Follow instructions from your health care provider about hydration, which may include:  Up to 2 hours before the procedure - you may continue to drink clear liquids, such as water, clear fruit juice, black coffee, and plain tea.  Eating and drinking restrictions Follow instructions from your health care provider about eating and drinking, which may include:  8 hours before the procedure - stop eating heavy meals or foods such as meat, fried foods, or fatty foods.  6 hours before the procedure - stop eating light meals or foods, such as toast or cereal.  6 hours before the procedure - stop drinking milk or drinks that contain milk.  2 hours before the procedure - stop drinking clear liquids.  General instructions  Do not use any products that contain nicotine or tobacco, such as cigarettes and e-cigarettes. If you need help quitting, ask your health care provider.  You may be asked to shower with a germ-killing soap.  Plan to have someone take you home from the hospital or clinic.  Plan to have a responsible adult care for you for at least 24 hours after you leave the hospital or clinic. This is important. What happens during the procedure?  To lower your risk of infection: ? Your health care team will wash or sanitize their hands. ? Hair may be removed from the surgical area. ? Your skin  will be washed with soap.  An IV will be inserted into one of your veins.  You will be given one or more of the following: ? A medicine to help you relax (sedative). ? A medicine to make you fall asleep (general anesthetic).  Three small incisions will be made in your abdomen.  Your abdomen will be inflated with carbon dioxide gas to make the surgical area  easier to see.  A laparoscope and surgical instruments will be inserted through the incisions. The laparoscope will send images of the inside of your abdomen to a monitor in the room.  Tissue that is bulging through the hernia may be removed or moved back into its normal place.  The hernia opening will be closed with a sheet of surgical mesh.  The surgical instruments and laparoscope will be removed.  Your incisions will be closed with stitches (sutures) and adhesive strips.  A bandage (dressing) will be placed over your incisions. The procedure may vary among health care providers and hospitals. What happens after the procedure?  Your blood pressure, heart rate, breathing rate, and blood oxygen level will be monitored until the medicines you were given have worn off.  You will be given pain medicine as needed.  You may continue to receive medicines and fluids through an IV. The IV will be removed after you can drink fluids well.  You will be encouraged to get up and move around, and to take deep breaths frequently.  Do not drive for 24 hours if you were given a sedative during your procedure. Summary  Laparoscopic inguinal hernia repair is a surgical procedure to repair a small weak spot in the groin muscles that allows fat or intestine from inside the abdomen to bulge out (inguinal hernia).  This procedure is done through three small incisions in the abdomen. A thin tube with a light and camera on the end (laparoscope) will be used to help perform the procedure.  After the procedure, you will be encouraged to get up and move around, and to take deep breaths frequently. This information is not intended to replace advice given to you by your health care provider. Make sure you discuss any questions you have with your health care provider. Document Released: 06/19/2016 Document Revised: 06/19/2016 Document Reviewed: 06/19/2016 Elsevier Interactive Patient Education  2018 [employer].

## 2017-12-24 NOTE — Progress Notes (Signed)
Patient ID: Samuel Mcbride, male   DOB: [birth year], 84 y.o.   MRN: [id number]  Chief Complaint  Patient presents with  . Other    Hernia    HPI Samuel Mcbride is a 84 y.o. male here for evaluation of a hernia. He reports that the pain is in the left groin area. He started having bad pain yesterday prompting presenting to the urgent care center and then to the ED.  They did a CT scan which showed an inguinal hernia on the left side containing fat.   He reports that he first noticed bulging in the area about 5 days ago with mild pain and bad constipation.   Today the pain is much better with just some burning when he stands.   He is no longer having problems with using the bathroom.   He is here today with [name] and his daughter in law Romana Juniper (wife of his other son, Annette Stable).  HPI  Past Medical History:  Diagnosis Date  . CHF (congestive heart failure) (HCC)   . Chronic kidney disease, stage III (moderate) (HCC)   . Hyperlipidemia   . Hypertension   . Kidney disease, chronic, stage III (GFR 30-59 ml/min) (HCC)   . MI (myocardial infarction) (HCC) 2015    Past Surgical History:  Procedure Laterality Date  . CARDIAC CATHETERIZATION  12/14   [facility]  . CHOLECYSTECTOMY    . PROSTATE ABLATION      Family History  Problem Relation Age of Onset  . Heart disease Father     Social History Social History   Tobacco Use  . Smoking status: Never Smoker  . Smokeless tobacco: Former Neurosurgeon    Types: Chew  Substance Use Topics  . Alcohol use: No  . Drug use: No    Allergies  Allergen Reactions  . Codeine Other (See Comments)    dizziness    Current Outpatient Medications  Medication Sig Dispense Refill  . acetaminophen (TYLENOL) 500 MG tablet Take 500 mg by mouth every 4 (four) hours as needed for moderate pain.     Marland Kitchen albuterol (PROVENTIL HFA;VENTOLIN HFA) 108 (90 Base) MCG/ACT inhaler Inhale 1-2 puffs into the lungs every 6 (six) hours as needed for wheezing or  shortness of breath. 1 Inhaler 1  . aspirin 81 MG tablet Take 1 tablet (81 mg total) by mouth daily. 30 tablet 3  . benzonatate (TESSALON PERLES) 100 MG capsule Take 1 capsule (100 mg total) by mouth 3 (three) times daily as needed for cough (Take 1-2 per dose). 30 capsule 0  . carvedilol (COREG) 6.25 MG tablet TAKE 1 TABLET twice a day (Patient taking differently: Take 6.25 mg by mouth 2 (two) times daily with a meal. TAKE 1 TABLET twice a day) 180 tablet 3  . cetirizine (ZYRTEC) 10 MG tablet Take 10 mg by mouth daily.    . Cholecalciferol 1000 units tablet Take 1,000 Units by mouth daily.     . clopidogrel (PLAVIX) 75 MG tablet Take 1 tablet (75 mg total) by mouth daily with breakfast. 90 tablet 3  . furosemide (LASIX) 20 MG tablet Take 1 tablet (20 mg total) by mouth daily. 90 tablet 3  . losartan (COZAAR) 50 MG tablet TAKE 1 TABLET (50 MG TOTAL) BY MOUTH DAILY. (Patient taking differently: Take 50 mg by mouth daily. TAKE 1 TABLET (50 MG TOTAL) BY MOUTH DAILY.) 90 tablet 3  . montelukast (SINGULAIR) 10 MG tablet Take 10 mg by mouth at bedtime.    Marland Kitchen  nitroGLYCERIN (NITROSTAT) 0.4 MG SL tablet Place 1 tablet (0.4 mg total) under the tongue every 5 (five) minutes as needed for chest pain. 25 tablet 0  . simvastatin (ZOCOR) 20 MG tablet Take 1 tablet (20 mg total) by mouth daily. 90 tablet 3  . polyethylene glycol (MIRALAX / GLYCOLAX) packet Take 17 g by mouth daily.     No current facility-administered medications for this visit.     Review of Systems Review of Systems  Constitutional: Negative.   Respiratory: Negative.   Cardiovascular: Negative.     Blood pressure 124/77, pulse 78, resp. rate 14, height 6' (1.829 m), weight 201 lb 6.4 oz (91.4 kg).  Physical Exam Physical Exam  Constitutional: He is oriented to person, place, and time. He appears well-developed and well-nourished.  The patient ambulated into the office without obvious discomfort.  He was awake alert and oriented.   Cooperative to the exam.  Lives independently with his wife of 5 years.  Eyes: Conjunctivae are normal. No scleral icterus.  Neck: Neck supple.  Cardiovascular: Normal rate, regular rhythm and normal heart sounds.  No lower extremity edema noted.  Pulmonary/Chest: Effort normal and breath sounds normal.  Abdominal: A hernia is present. Hernia confirmed positive in the left inguinal area.  Genitourinary:     Lymphadenopathy:    He has no cervical adenopathy.    He has no axillary adenopathy.       Right: No inguinal adenopathy present.       Left: No inguinal adenopathy present.  Neurological: He is alert and oriented to person, place, and time.  Skin: Skin is warm and dry.  Psychiatric: He has a normal mood and affect.    Data Reviewed Emergency department notes of December 23, 2017 reviewed.  Plain films of the abdomen as well as CT of the abdomen and pelvis dated December 23, 2017 reviewed.  The patient has a mass of adipose tissue within the left inguinal region, likely a direct defect.  No involvement of the bowel.  No bowel dilatation.  Small area of mesenteric fat irritation.  Laboratory studies of the same date showed a hemoglobin of 41.5 with an MCV of 97.8.  White blood cell count of 6700 with normal differential.  Platelet count 197,000.  Normal lactic acid. Basic metabolic panel showed a creatinine of 1.98 with an estimated GFR of 28.  Creatinine 1.52 years ago.    Cardiology notes of a routine visit with Julien Nordmann, MD, PhD of December 21, 2017 were reviewed.  Stable cardiovascular disease, one-year follow-up recommended.      Assessment    Left inguinal hernia with incarcerated omentum.    Plan    The patient has had complete resolution of the severe pain that prompted emergency evaluation yesterday.  The omentum is providing complete occlusion of the fascial defect.  He is presently asymptomatic in regards to diet and bowel function.  No urinary  difficulties.  Options for management were reviewed: 1) observation with the idea that the omentum is obstructing the defect and is unlikely to have any future issues versus 2 (elective repair based on his in general reasonably good health, and very active lifestyle.  There is a possibility if the acute swelling in the omentum resolved and it was able to self reduce, he could then be at risk for a intestinal obstruction.  Considering his recent cardiology evaluation I think it reasonable to offer him an active person elective repair.  I would prefer not to  operate while the patient is on Plavix, and I think this seizure can be safely deferred until the Plavix is cleared his system.  He will continue his daily aspirin tablet as antiplatelet therapy.  The patient did give permission to speak with his granddaughter, Ilda Mori who works in the medical staff office.    Hernia precautions and incarceration were discussed with the patient. If they develop symptoms of an incarcerated hernia, they were encouraged to seek prompt medical attention.  I have recommended repair of the hernia using mesh on an outpatient basis in the near future. The risk of infection was reviewed. The role of prosthetic mesh to minimize the risk of recurrence was reviewed.   HPI, Physical Exam, Assessment and Plan have been scribed under the direction and in the presence of Earline Mayotte, MD  Carron Brazen, LPN  I have completed the exam and reviewed the above documentation for accuracy and completeness.  I agree with the above.  Museum/gallery conservator has been used and any errors in dictation or transcription are unintentional.  Donnalee Curry, M.D., F.A.C.S.  Merrily Pew Byrnett 12/25/2017, 7:06 AM   Patient's surgery has been scheduled for 12-30-17 at [facility] with Dr. Lemar Livings. Patient has been asked to stop Plavix.  Nicholes Mango, CMA

## 2017-12-25 ENCOUNTER — Telehealth: Payer: Self-pay

## 2017-12-25 DIAGNOSIS — K4030 Unilateral inguinal hernia, with obstruction, without gangrene, not specified as recurrent: Secondary | ICD-10-CM | POA: Insufficient documentation

## 2017-12-25 NOTE — Telephone Encounter (Signed)
The patient will pre admit at the hospital in the [facility] on 12/28/17 at 1:45 pm. I have left a detailed message on his son's phone.

## 2017-12-25 NOTE — Telephone Encounter (Signed)
Notified of arrival time and location for pre admit testing.

## 2017-12-28 ENCOUNTER — Other Ambulatory Visit: Payer: Self-pay

## 2017-12-28 ENCOUNTER — Encounter
Admission: RE | Admit: 2017-12-28 | Discharge: 2017-12-28 | Disposition: A | Discharge status: Home / Self Care | Payer: Medicare Other | Source: Ambulatory Visit | Attending: General Surgery | Admitting: General Surgery

## 2017-12-28 DIAGNOSIS — Z01812 Encounter for preprocedural laboratory examination: Secondary | ICD-10-CM

## 2017-12-28 DIAGNOSIS — Z7982 Long term (current) use of aspirin: Secondary | ICD-10-CM | POA: Diagnosis not present

## 2017-12-28 DIAGNOSIS — Z7902 Long term (current) use of antithrombotics/antiplatelets: Secondary | ICD-10-CM | POA: Diagnosis not present

## 2017-12-28 DIAGNOSIS — I509 Heart failure, unspecified: Secondary | ICD-10-CM | POA: Diagnosis not present

## 2017-12-28 DIAGNOSIS — Z87891 Personal history of nicotine dependence: Secondary | ICD-10-CM | POA: Diagnosis not present

## 2017-12-28 DIAGNOSIS — N183 Chronic kidney disease, stage 3 (moderate): Secondary | ICD-10-CM | POA: Diagnosis not present

## 2017-12-28 DIAGNOSIS — I252 Old myocardial infarction: Secondary | ICD-10-CM | POA: Diagnosis not present

## 2017-12-28 DIAGNOSIS — K4030 Unilateral inguinal hernia, with obstruction, without gangrene, not specified as recurrent: Secondary | ICD-10-CM | POA: Diagnosis present

## 2017-12-28 DIAGNOSIS — E785 Hyperlipidemia, unspecified: Secondary | ICD-10-CM | POA: Diagnosis not present

## 2017-12-28 DIAGNOSIS — I130 Hypertensive heart and chronic kidney disease with heart failure and stage 1 through stage 4 chronic kidney disease, or unspecified chronic kidney disease: Secondary | ICD-10-CM | POA: Diagnosis not present

## 2017-12-28 DIAGNOSIS — Z79899 Other long term (current) drug therapy: Secondary | ICD-10-CM | POA: Diagnosis not present

## 2017-12-28 HISTORY — DX: Unilateral inguinal hernia, without obstruction or gangrene, not specified as recurrent: K40.90

## 2017-12-28 HISTORY — DX: Pneumonia, unspecified organism: J18.9

## 2017-12-28 NOTE — Patient Instructions (Signed)
Your procedure is scheduled on: Wednesday, December 30, 2017 Report to Day Surgery on the 2nd floor of the [facility] To find out your arrival time, please call [phone] between 1PM - 3PM on: Tuesday, December 29, 2017  REMEMBER: Instructions that are not followed completely may result in serious medical risk, up to and including death; or upon the discretion of your surgeon and anesthesiologist your surgery may need to be rescheduled.  Do not eat food after midnight the night before surgery.  No gum chewing, lozengers or hard candies.  You may however, drink CLEAR liquids up to 2 hours before you are scheduled to arrive for your surgery. Do not drink anything within 2 hours of the start of your surgery.  Clear liquids include: - water  - apple juice without pulp - gatorade - black coffee or tea (Do NOT add milk or creamers to the coffee or tea) Do NOT drink anything that is not on this list.  No Alcohol for 24 hours before or after surgery.  No Smoking including e-cigarettes for 24 hours prior to surgery.  No chewable tobacco products for at least 6 hours prior to surgery.  No nicotine patches on the day of surgery.  On the morning of surgery brush your teeth with toothpaste and water, you may rinse your mouth with mouthwash if you wish. Do not swallow any toothpaste or mouthwash.  Notify your doctor if there is any change in your medical condition (cold, fever, infection).  Do not wear jewelry, make-up, hairpins, clips or nail polish.  Do not wear lotions, powders, or perfumes. You may wear deodorant.  Do not shave 48 hours prior to surgery. Men may shave face and neck.  Contacts and dentures may not be worn into surgery.  Do not bring valuables to the hospital, including drivers license, insurance or credit cards.  [facility] is not responsible for any belongings or valuables.   TAKE THESE MEDICATIONS THE MORNING OF SURGERY:  1.  Albuterol inhaler 2.  Carvedilol  Use CHG Soap as directed on instruction sheet.  Use inhalers on the day of surgery and bring to the hospital.  Follow recommendations from Cardiologist, Pulmonologist or PCP regarding stopping Aspirin, Plavix.  NOW!  Stop Anti-inflammatories (NSAIDS) such as Advil, Aleve, Ibuprofen, Motrin, Naproxen, Naprosyn and Aspirin based products such as Excedrin, Goodys Powder, BC Powder. (May take Tylenol or Acetaminophen if needed.)  NOW!  Stop ANY OVER THE COUNTER supplements until after surgery. (May continue Vitamin B.)  Wear comfortable clothing (specific to your surgery type) to the hospital.  Plan for stool softeners for home use.  If you are being discharged the day of surgery, you will not be allowed to drive home. You will need a responsible adult to drive you home and stay with you that night.   Please call [phone] if you have any questions about these instructions.

## 2017-12-29 MED ORDER — CEFAZOLIN SODIUM-DEXTROSE 2-4 GM/100ML-% IV SOLN
2.0000 g | INTRAVENOUS | Status: AC
  Administered 2017-12-30: 2 g via INTRAVENOUS

## 2017-12-30 ENCOUNTER — Ambulatory Visit
Admission: RE | Admit: 2017-12-30 | Discharge: 2017-12-30 | Disposition: A | Discharge status: Home / Self Care | Payer: Medicare Other | Source: Ambulatory Visit | Attending: General Surgery | Admitting: General Surgery

## 2017-12-30 ENCOUNTER — Ambulatory Visit: Payer: Medicare Other | Admitting: Anesthesiology

## 2017-12-30 ENCOUNTER — Other Ambulatory Visit: Payer: Self-pay

## 2017-12-30 ENCOUNTER — Encounter
Admission: RE | Disposition: A | Discharge status: Home / Self Care | Payer: Self-pay | Source: Ambulatory Visit | Attending: General Surgery

## 2017-12-30 DIAGNOSIS — Z7982 Long term (current) use of aspirin: Secondary | ICD-10-CM | POA: Insufficient documentation

## 2017-12-30 DIAGNOSIS — I252 Old myocardial infarction: Secondary | ICD-10-CM | POA: Insufficient documentation

## 2017-12-30 DIAGNOSIS — N183 Chronic kidney disease, stage 3 (moderate): Secondary | ICD-10-CM | POA: Insufficient documentation

## 2017-12-30 DIAGNOSIS — Z79899 Other long term (current) drug therapy: Secondary | ICD-10-CM | POA: Insufficient documentation

## 2017-12-30 DIAGNOSIS — K4030 Unilateral inguinal hernia, with obstruction, without gangrene, not specified as recurrent: Secondary | ICD-10-CM | POA: Insufficient documentation

## 2017-12-30 DIAGNOSIS — I130 Hypertensive heart and chronic kidney disease with heart failure and stage 1 through stage 4 chronic kidney disease, or unspecified chronic kidney disease: Secondary | ICD-10-CM | POA: Insufficient documentation

## 2017-12-30 DIAGNOSIS — I509 Heart failure, unspecified: Secondary | ICD-10-CM | POA: Insufficient documentation

## 2017-12-30 DIAGNOSIS — E785 Hyperlipidemia, unspecified: Secondary | ICD-10-CM | POA: Insufficient documentation

## 2017-12-30 DIAGNOSIS — K4031 Unilateral inguinal hernia, with obstruction, without gangrene, recurrent: Secondary | ICD-10-CM

## 2017-12-30 DIAGNOSIS — Z7902 Long term (current) use of antithrombotics/antiplatelets: Secondary | ICD-10-CM | POA: Insufficient documentation

## 2017-12-30 DIAGNOSIS — Z87891 Personal history of nicotine dependence: Secondary | ICD-10-CM | POA: Insufficient documentation

## 2017-12-30 HISTORY — PX: INGUINAL HERNIA REPAIR: SHX194

## 2017-12-30 SURGERY — REPAIR, HERNIA, INGUINAL, ADULT
Anesthesia: General | Laterality: Left

## 2017-12-30 MED ORDER — SODIUM CHLORIDE 0.9 % IV SOLN
INTRAVENOUS | Status: DC | PRN
  Administered 2017-12-30: 25 ug/min via INTRAVENOUS

## 2017-12-30 MED ORDER — LIDOCAINE HCL (PF) 2 % IJ SOLN
INTRAMUSCULAR | Status: AC
  Filled 2017-12-30: qty 10

## 2017-12-30 MED ORDER — FENTANYL CITRATE (PF) 100 MCG/2ML IJ SOLN
INTRAMUSCULAR | Status: AC
  Filled 2017-12-30: qty 2

## 2017-12-30 MED ORDER — ONDANSETRON HCL 4 MG/2ML IJ SOLN
INTRAMUSCULAR | Status: AC
  Filled 2017-12-30: qty 2

## 2017-12-30 MED ORDER — FENTANYL CITRATE (PF) 100 MCG/2ML IJ SOLN
INTRAMUSCULAR | Status: DC | PRN
  Administered 2017-12-30 (×2): 50 ug via INTRAVENOUS

## 2017-12-30 MED ORDER — DEXAMETHASONE SODIUM PHOSPHATE 10 MG/ML IJ SOLN
INTRAMUSCULAR | Status: AC
  Filled 2017-12-30: qty 1

## 2017-12-30 MED ORDER — ACETAMINOPHEN 10 MG/ML IV SOLN
INTRAVENOUS | Status: DC | PRN
  Administered 2017-12-30: 1000 mg via INTRAVENOUS

## 2017-12-30 MED ORDER — BUPIVACAINE-EPINEPHRINE (PF) 0.5% -1:200000 IJ SOLN
INTRAMUSCULAR | Status: DC | PRN
  Administered 2017-12-30: 30 mL

## 2017-12-30 MED ORDER — HYDROCODONE-ACETAMINOPHEN 5-325 MG PO TABS: 1.0000 | ORAL_TABLET | ORAL | 0 refills | Status: DC | PRN

## 2017-12-30 MED ORDER — FAMOTIDINE 20 MG PO TABS
20.0000 mg | ORAL_TABLET | Freq: Once | ORAL | Status: AC
  Administered 2017-12-30: 20 mg via ORAL

## 2017-12-30 MED ORDER — DEXAMETHASONE SODIUM PHOSPHATE 10 MG/ML IJ SOLN
INTRAMUSCULAR | Status: DC | PRN
  Administered 2017-12-30: 6 mg via INTRAVENOUS

## 2017-12-30 MED ORDER — LIDOCAINE HCL (CARDIAC) PF 100 MG/5ML IV SOSY
PREFILLED_SYRINGE | INTRAVENOUS | Status: DC | PRN
  Administered 2017-12-30: 100 mg via INTRAVENOUS

## 2017-12-30 MED ORDER — CEFAZOLIN SODIUM-DEXTROSE 2-4 GM/100ML-% IV SOLN
INTRAVENOUS | Status: AC
  Filled 2017-12-30: qty 100

## 2017-12-30 MED ORDER — SUGAMMADEX SODIUM 200 MG/2ML IV SOLN
INTRAVENOUS | Status: AC
  Filled 2017-12-30: qty 2

## 2017-12-30 MED ORDER — PHENYLEPHRINE HCL 10 MG/ML IJ SOLN
INTRAMUSCULAR | Status: DC | PRN
  Administered 2017-12-30: 200 ug via INTRAVENOUS

## 2017-12-30 MED ORDER — ACETAMINOPHEN 10 MG/ML IV SOLN
INTRAVENOUS | Status: AC
  Filled 2017-12-30: qty 100

## 2017-12-30 MED ORDER — FENTANYL CITRATE (PF) 100 MCG/2ML IJ SOLN: 25.0000 ug | INTRAMUSCULAR | Status: DC | PRN

## 2017-12-30 MED ORDER — ROCURONIUM BROMIDE 50 MG/5ML IV SOLN
INTRAVENOUS | Status: AC
  Filled 2017-12-30: qty 1

## 2017-12-30 MED ORDER — PROMETHAZINE HCL 25 MG/ML IJ SOLN: 6.2500 mg | INTRAMUSCULAR | Status: DC | PRN

## 2017-12-30 MED ORDER — SODIUM CHLORIDE 0.9 % IV SOLN
INTRAVENOUS | Status: DC
  Administered 2017-12-30: 11:00:00 via INTRAVENOUS

## 2017-12-30 MED ORDER — PROPOFOL 10 MG/ML IV BOLUS
INTRAVENOUS | Status: DC | PRN
  Administered 2017-12-30: 140 mg via INTRAVENOUS

## 2017-12-30 MED ORDER — FAMOTIDINE 20 MG PO TABS
ORAL_TABLET | ORAL | Status: AC
  Administered 2017-12-30: 20 mg via ORAL
  Filled 2017-12-30: qty 1

## 2017-12-30 MED ORDER — MEPERIDINE HCL 50 MG/ML IJ SOLN: 6.2500 mg | INTRAMUSCULAR | Status: DC | PRN

## 2017-12-30 MED ORDER — PROPOFOL 10 MG/ML IV BOLUS
INTRAVENOUS | Status: AC
  Filled 2017-12-30: qty 20

## 2017-12-30 SURGICAL SUPPLY — 34 items
BLADE SURG 15 STRL SS SAFETY (BLADE) ×6 IMPLANT
CANISTER SUCT 1200ML W/VALVE (MISCELLANEOUS) ×3 IMPLANT
CHLORAPREP W/TINT 26ML (MISCELLANEOUS) ×3 IMPLANT
CLOSURE WOUND 1/2 X4 (GAUZE/BANDAGES/DRESSINGS) ×1
COVER WAND RF STERILE (DRAPES) ×3 IMPLANT
DECANTER SPIKE VIAL GLASS SM (MISCELLANEOUS) ×3 IMPLANT
DRAIN PENROSE 1/4X12 LTX (DRAIN) ×3 IMPLANT
DRAPE LAPAROTOMY 100X77 ABD (DRAPES) ×3 IMPLANT
DRSG TEGADERM 4X4.75 (GAUZE/BANDAGES/DRESSINGS) ×3 IMPLANT
DRSG TELFA 4X3 1S NADH ST (GAUZE/BANDAGES/DRESSINGS) ×3 IMPLANT
ELECT REM PT RETURN 9FT ADLT (ELECTROSURGICAL) ×3
ELECTRODE REM PT RTRN 9FT ADLT (ELECTROSURGICAL) ×1 IMPLANT
GLOVE BIO SURGEON STRL SZ7.5 (GLOVE) ×3 IMPLANT
GLOVE INDICATOR 8.0 STRL GRN (GLOVE) ×3 IMPLANT
GOWN STRL REUS W/ TWL LRG LVL3 (GOWN DISPOSABLE) ×2 IMPLANT
GOWN STRL REUS W/TWL LRG LVL3 (GOWN DISPOSABLE) ×4
KIT TURNOVER KIT A (KITS) ×3 IMPLANT
LABEL OR SOLS (LABEL) ×3 IMPLANT
MESH MARLEX PLUG MEDIUM (Mesh General) ×2 IMPLANT
NEEDLE HYPO 22GX1.5 SAFETY (NEEDLE) ×6 IMPLANT
PACK BASIN MINOR ARMC (MISCELLANEOUS) ×3 IMPLANT
STRIP CLOSURE SKIN 1/2X4 (GAUZE/BANDAGES/DRESSINGS) ×2 IMPLANT
SUT PDS AB 0 CT1 27 (SUTURE) ×3 IMPLANT
SUT SURGILON 0 BLK (SUTURE) ×6 IMPLANT
SUT VIC AB 2-0 SH 27 (SUTURE) ×2
SUT VIC AB 2-0 SH 27XBRD (SUTURE) ×1 IMPLANT
SUT VIC AB 3-0 54X BRD REEL (SUTURE) ×1 IMPLANT
SUT VIC AB 3-0 BRD 54 (SUTURE) ×2
SUT VIC AB 3-0 SH 27 (SUTURE) ×2
SUT VIC AB 3-0 SH 27X BRD (SUTURE) ×1 IMPLANT
SUT VIC AB 4-0 FS2 27 (SUTURE) ×3 IMPLANT
SWABSTK COMLB BENZOIN TINCTURE (MISCELLANEOUS) ×3 IMPLANT
SYR 10ML LL (SYRINGE) ×3 IMPLANT
SYR 3ML LL SCALE MARK (SYRINGE) ×3 IMPLANT

## 2017-12-30 NOTE — Anesthesia Preprocedure Evaluation (Signed)
Anesthesia Evaluation  Patient identified by MRN, date of birth, ID band Patient awake    Reviewed: Allergy & Precautions, NPO status , Patient's Chart, lab work & pertinent test results  History of Anesthesia Complications Negative for: history of anesthetic complications  Airway Mallampati: III  TM Distance: >3 FB Neck ROM: Full    Dental  (+) Poor Dentition, Missing   Pulmonary neg sleep apnea, neg COPD, former smoker, neg PE   breath sounds clear to auscultation- rhonchi (-) wheezing      Cardiovascular hypertension, + CAD, + Past MI and +CHF  (-) Cardiac Stents and (-) CABG  Rhythm:Regular Rate:Normal - Systolic murmurs and - Diastolic murmurs Echo 07/06/14: - Left ventricle: The cavity size was normal. Systolic function was moderately to severely reduced. The estimated ejection fraction was in the range of 30% to 35%. Images were inadequate for LV wall motion assessment. Select images suggestive of anterior wall hypokinesis. Doppler parameters are consistent with abnormal left ventricular relaxation (grade 1 diastolic dysfunction). - Left atrium: The atrium was normal in size. - Right ventricle: Systolic function was normal. - Pulmonary arteries: Systolic pressure was within the normal range.   Neuro/Psych negative neurological ROS  negative psych ROS   GI/Hepatic negative GI ROS, Neg liver ROS,   Endo/Other  negative endocrine ROSneg diabetes  Renal/GU Renal InsufficiencyRenal disease     Musculoskeletal negative musculoskeletal ROS (+)   Abdominal (+) - obese,   Peds  Hematology negative hematology ROS (+)   Anesthesia Other Findings Past Medical History: No date: CHF (congestive heart failure) (HCC) No date: Chronic kidney disease, stage III (moderate) (HCC) No date: Hyperlipidemia No date: Hypertension 2019: Inguinal hernia, left No date: Kidney disease, chronic, stage III (GFR 30-59  ml/min) (HCC) 2015: MI (myocardial infarction) (HCC) No date: Pneumonia   Reproductive/Obstetrics                             Anesthesia Physical Anesthesia Plan  ASA: III  Anesthesia Plan: General   Post-op Pain Management:    Induction: Intravenous  PONV Risk Score and Plan: 1 and Ondansetron and Midazolam  Airway Management Planned: LMA  Additional Equipment:   Intra-op Plan:   Post-operative Plan:   Informed Consent: I have reviewed the patients History and Physical, chart, labs and discussed the procedure including the risks, benefits and alternatives for the proposed anesthesia with the patient or authorized representative who has indicated his/her understanding and acceptance.   Dental advisory given  Plan Discussed with: CRNA and Anesthesiologist  Anesthesia Plan Comments:         Anesthesia Quick Evaluation

## 2017-12-30 NOTE — Op Note (Signed)
Preoperative diagnosis: Incarcerated left inguinal hernia (omentum).  Postoperative diagnosis: Same.  Open left inguinal hernia repair with resection of entrapped omentum, repair with medium PerFix plug and patch.  Operating Surgeon: Donnalee Curry, MD.  Anesthesia: General by LMA, Marcaine 0.5% with 1 to 200,000 units of epinephrine, 30 cc.,  Toradol: 30 mg.  Estimated blood loss: Less than 5 cc.  Clinical note: This 84 year old male presented with a 1 day history of severe left groin pain that had resolved by the time of evaluation.  CT scan showed evidence of omentum in a left inguinal hernia.  No intestinal involvement.  The patient had no nausea, vomiting or difficulty with urination, and as he was on Plavix it was elected to defer surgery until the Plavix is cleared his system.  Patient is admitted today for elective repair.  He received Kefzol prior to the procedure.  Hair was removed from the surgical site with clippers prior to presentation of the operating theater.  Operative note: With the patient under adequate general anesthesia the abdomen and left groin were cleansed with ChloraPrep and draped.  Marcaine was infiltrated for postoperative analgesia.  A 7 mm skin line incision along the anticipated course the inguinal canal was carried down through skin subtendinous tissue with hemostasis achieved by electrocautery.  The external Bleich was opened in the direction of its fibers.  The ilioinguinal nerve was likely divided and ligated during initial exploration.  Ligation completed to minimize neuroma formation.  The iliohypogastric nerve was mobilized and protected.  The cord was mobilized circumferentially.  It was necessary to open the hernia sac to extract the omentum.  After this was completed the sac itself could be delivered.  Photograph placed in the media section.  This was dissected back to the level of the internal ring.  It was closed with a running 2-0 Vicryl baseball stitch  with good hemostasis.  The preperitoneal space was cleared.  No evidence of an indirect hernia or femoral hernia.  It was like to make use of a medium Bard PerFix plug.  This was placed in the preperitoneal space and then anchored to the iliopubic tract with a 0 Surgilon figure-of-eight suture.  An onlay mesh of polypropylene was then placed and anchored to the pubic tubercle with a 0 Surgilon.  The inferior aspect of the mesh was anchored to the inguinal ligament and the medial and superior borders to the transverse abdominis aponeurosis.  The lateral slit was closed and the iliohypogastric nerve return to its bed.  Toradol was placed in the wound.  The external Bleich was closed with a running 2-0 Vicryl suture.  Scarpa's fascia was closed with a running 3-0 Vicryl suture.  The skin closed with a running 4-0 Vicryl subarticular suture.  Benzoin, Steri-Strips, Telfa and Tegaderm dressing applied.  The patient tolerated the procedure well was taken recovery room in stable condition.

## 2017-12-30 NOTE — Anesthesia Post-op Follow-up Note (Signed)
Anesthesia QCDR form completed.        

## 2017-12-30 NOTE — Discharge Instructions (Signed)
°  Inguinal Hernia, Adult An inguinal hernia is when fat or the intestines push through the area where the leg meets the lower belly (groin) and make a rounded lump (bulge). This condition happens over time. There are three types of inguinal hernias. These types include:  Hernias that can be pushed back into the belly (are reducible).  Hernias that cannot be pushed back into the belly (are incarcerated).  Hernias that cannot be pushed back into the belly and lose their blood supply (get strangulated). This type needs emergency surgery.  Follow these instructions at home: Lifestyle  Drink enough fluid to keep your urine (pee) clear or pale yellow.  Eat plenty of fruits, vegetables, and whole grains. These have a lot of fiber. Talk with your doctor if you have questions.  Avoid lifting heavy objects.  Avoid standing for long periods of time.  Do not use tobacco products. These include cigarettes, chewing tobacco, or e-cigarettes. If you need help quitting, ask your doctor.  Try to stay at a healthy weight. General instructions  Do not try to force the hernia back in.  Watch your hernia for any changes in color or size. Let your doctor know if there are any changes.  Take over-the-counter and prescription medicines only as told by your doctor.  Keep all follow-up visits as told by your doctor. This is important. Contact a doctor if:  You have a fever.  You have new symptoms.  Your symptoms get worse. Get help right away if:  The area where the legs meets the lower belly has: ? Pain that gets worse suddenly. ? A bulge that gets bigger suddenly and does not go down. ? A bulge that turns red or purple. ? A bulge that is painful to the touch.  You are a man and your scrotum: ? Suddenly feels painful. ? Suddenly changes in size.  You feel sick to your stomach (nauseous) and this feeling does not go away.  You throw up (vomit) and this keeps happening.  You feel your  heart beating a lot more quickly than normal.  You cannot poop (have a bowel movement) or pass gas. This information is not intended to replace advice given to you by your health care provider. Make sure you discuss any questions you have with your health care provider. AMBULATORY SURGERY  DISCHARGE INSTRUCTIONS   1) The drugs that you were given will stay in your system until tomorrow so for the next 24 hours you should not:  A) Drive an automobile B) Make any legal decisions C) Drink any alcoholic beverage   2) You may resume regular meals tomorrow.  Today it is better to start with liquids and gradually work up to solid foods.  You may eat anything you prefer, but it is better to start with liquids, then soup and crackers, and gradually work up to solid foods.   3) Please notify your doctor immediately if you have any unusual bleeding, trouble breathing, redness and pain at the surgery site, drainage, fever, or pain not relieved by medication.    4) Additional Instructions:        Please contact your physician with any problems or Same Day Surgery at [phone], Monday through Friday 6 am to 4 pm, or [place] at [facility] number at [phone].

## 2017-12-30 NOTE — Transfer of Care (Signed)
Immediate Anesthesia Transfer of Care Note  Patient: Samuel Mcbride  Procedure(s) Performed: HERNIA REPAIR INGUINAL ADULT (Left )  Patient Location: PACU  Anesthesia Type:General  Level of Consciousness: awake  Airway & Oxygen Therapy: Patient Spontanous Breathing and Patient connected to face mask oxygen  Post-op Assessment: Report given to RN and Post -op Vital signs reviewed and stable  Post vital signs: Reviewed and stable  Last Vitals:  Vitals Value Taken Time  BP    Temp    Pulse 70 12/30/2017  1:59 PM  Resp 13 12/30/2017  1:59 PM  SpO2 98 % 12/30/2017  1:59 PM  Vitals shown include unvalidated device data.  Last Pain:  Vitals:   12/30/17 1055  TempSrc: Tympanic  PainSc: 0-No pain         Complications: No apparent anesthesia complications

## 2017-12-30 NOTE — Anesthesia Procedure Notes (Signed)
Procedure Name: LMA Insertion Date/Time: 12/30/2017 12:32 PM Performed by: Danelle Berry, CRNA Pre-anesthesia Checklist: Patient identified, Emergency Drugs available, Suction available, Patient being monitored and Timeout performed Patient Re-evaluated:Patient Re-evaluated prior to induction Oxygen Delivery Method: Circle system utilized and Simple face mask Preoxygenation: Pre-oxygenation with 100% oxygen Induction Type: IV induction Ventilation: Mask ventilation without difficulty LMA: LMA inserted LMA Size: 4.5 Number of attempts: 2 Placement Confirmation: positive ETCO2 Dental Injury: Teeth and Oropharynx as per pre-operative assessment

## 2017-12-30 NOTE — H&P (Signed)
TRISTRAM STHILL [id number] 1925-08-05     HPI:  Patient seen last week with incarcerated omentum in a new onset left inguinal hernia. Pain free since day of event. No nausea/ vomiting. For left inguinal hernia repair. Family did take Plavix out of pill box six days ago as requested.   Medications Prior to Admission  Medication Sig Dispense Refill Last Dose  . albuterol (PROVENTIL HFA;VENTOLIN HFA) 108 (90 Base) MCG/ACT inhaler Inhale 1-2 puffs into the lungs every 6 (six) hours as needed for wheezing or shortness of breath. 1 Inhaler 1 12/30/2017 at Unknown time  . aspirin 81 MG tablet Take 1 tablet (81 mg total) by mouth daily. 30 tablet 3 12/28/2017  . benzonatate (TESSALON PERLES) 100 MG capsule Take 1 capsule (100 mg total) by mouth 3 (three) times daily as needed for cough (Take 1-2 per dose). 30 capsule 0 Past Week at Unknown time  . carvedilol (COREG) 6.25 MG tablet TAKE 1 TABLET twice a day (Patient taking differently: Take 6.25 mg by mouth 2 (two) times daily with a meal. TAKE 1 TABLET twice a day) 180 tablet 3 12/30/2017 at 0830  . cetirizine (ZYRTEC) 10 MG tablet Take 10 mg by mouth daily.   12/29/2017 at Unknown time  . Cholecalciferol 1000 units tablet Take 1,000 Units by mouth daily.    Past Week at Unknown time  . clopidogrel (PLAVIX) 75 MG tablet Take 1 tablet (75 mg total) by mouth daily with breakfast. 90 tablet 3 12/24/2017  . furosemide (LASIX) 20 MG tablet Take 1 tablet (20 mg total) by mouth daily. 90 tablet 3 12/29/2017 at Unknown time  . losartan (COZAAR) 50 MG tablet TAKE 1 TABLET (50 MG TOTAL) BY MOUTH DAILY. (Patient taking differently: Take 50 mg by mouth daily. TAKE 1 TABLET (50 MG TOTAL) BY MOUTH DAILY.) 90 tablet 3 12/29/2017 at Unknown time  . nitroGLYCERIN (NITROSTAT) 0.4 MG SL tablet Place 1 tablet (0.4 mg total) under the tongue every 5 (five) minutes as needed for chest pain. 25 tablet 0 Taking  . polyethylene glycol (MIRALAX / GLYCOLAX) packet Take 17 g by mouth  daily.   Past Week at Unknown time  . simvastatin (ZOCOR) 20 MG tablet Take 1 tablet (20 mg total) by mouth daily. 90 tablet 3 12/29/2017 at Unknown time  . acetaminophen (TYLENOL) 500 MG tablet Take 500 mg by mouth every 4 (four) hours as needed for moderate pain.    Not Taking at Unknown time   Allergies  Allergen Reactions  . Codeine Other (See Comments)    dizziness   Past Medical History:  Diagnosis Date  . CHF (congestive heart failure) (HCC)   . Chronic kidney disease, stage III (moderate) (HCC)   . Hyperlipidemia   . Hypertension   . Inguinal hernia, left 2019  . Kidney disease, chronic, stage III (GFR 30-59 ml/min) (HCC)   . MI (myocardial infarction) (HCC) 2015  . Pneumonia    Past Surgical History:  Procedure Laterality Date  . CARDIAC CATHETERIZATION  12/14   [facility]  . CHOLECYSTECTOMY    . PROSTATE ABLATION     Social History   Socioeconomic History  . Marital status: Married    Spouse name: Not on file  . Number of children: Not on file  . Years of education: Not on file  . Highest education level: Not on file  Occupational History  . Not on file  Social Needs  . Financial resource strain: Not on file  . Food  insecurity:    Worry: Not on file    Inability: Not on file  . Transportation needs:    Medical: Not on file    Non-medical: Not on file  Tobacco Use  . Smoking status: Former Smoker    Types: Cigarettes  . Smokeless tobacco: Former Neurosurgeon    Types: Chew  Substance and Sexual Activity  . Alcohol use: No  . Drug use: No  . Sexual activity: Not Currently  Lifestyle  . Physical activity:    Days per week: Not on file    Minutes per session: Not on file  . Stress: Not on file  Relationships  . Social connections:    Talks on phone: Not on file    Gets together: Not on file    Attends religious service: Not on file    Active member of club or organization: Not on file    Attends meetings of clubs or organizations: Not on file    Relationship  status: Not on file  . Intimate partner violence:    Fear of current or ex partner: Not on file    Emotionally abused: Not on file    Physically abused: Not on file    Forced sexual activity: Not on file  Other Topics Concern  . Not on file  Social History Narrative  . Not on file   Social History   Social History Narrative  . Not on file     ROS: Negative.     PE: HEENT: Negative. Lungs: Clear. Cardio: RR. Abd: Modest tenderness at area of incarcerated omentum in the left groin.   Assessment/Plan:  Proceed with planned left inguinal hernia repair.   Merrily Pew Leshae Mcclay 12/30/2017

## 2017-12-30 NOTE — Anesthesia Postprocedure Evaluation (Signed)
Anesthesia Post Note  Patient: Samuel Mcbride  Procedure(s) Performed: HERNIA REPAIR INGUINAL ADULT (Left )  Patient location during evaluation: PACU Anesthesia Type: General Level of consciousness: awake and alert and oriented Pain management: pain level controlled Vital Signs Assessment: post-procedure vital signs reviewed and stable Respiratory status: spontaneous breathing, nonlabored ventilation and respiratory function stable Cardiovascular status: blood pressure returned to baseline and stable Postop Assessment: no signs of nausea or vomiting Anesthetic complications: no     Last Vitals:  Vitals:   12/30/17 1430 12/30/17 1445  BP: 122/79 119/73  Pulse: 68 66  Resp: 12 14  Temp:  36.6 C  SpO2: 96% 95%    Last Pain:  Vitals:   12/30/17 1445  TempSrc:   PainSc: 0-No pain                 Taahir Grisby

## 2017-12-30 NOTE — OR Nursing (Signed)
Per Dr. Lemar Livings via tele [phone], ok to discharge pt to home without his visit to postop.

## 2017-12-31 ENCOUNTER — Encounter: Payer: Self-pay | Admitting: General Surgery

## 2018-01-05 ENCOUNTER — Ambulatory Visit (INDEPENDENT_AMBULATORY_CARE_PROVIDER_SITE_OTHER): Payer: Medicare Other | Admitting: General Surgery

## 2018-01-05 ENCOUNTER — Encounter: Payer: Self-pay | Admitting: General Surgery

## 2018-01-05 VITALS — BP 113/74 | HR 72 | Resp 13 | Ht 72.0 in | Wt 191.0 lb

## 2018-01-05 DIAGNOSIS — K4030 Unilateral inguinal hernia, with obstruction, without gangrene, not specified as recurrent: Secondary | ICD-10-CM

## 2018-01-05 NOTE — Progress Notes (Signed)
Patient ID: Samuel Mcbride, male   DOB: 01-Jan-1926, 84 y.o.   MRN: [id number]  Chief Complaint  Patient presents with  . Routine Post Op    HPI Samuel Mcbride is a 84 y.o. male here today for his post op left inguinal hernia repair done on 12/30/2017. Patient states the area has a burning pain.  This is intermittent, steadily improving.  No difficulty with bowel or bladder function.  Eating well.   He is here today with his son Samuel Mcbride, as well as his wife. Past Medical History:  Diagnosis Date  . CHF (congestive heart failure) (HCC)   . Chronic kidney disease, stage III (moderate) (HCC)   . Hyperlipidemia   . Hypertension   . Inguinal hernia, left 2019  . Kidney disease, chronic, stage III (GFR 30-59 ml/min) (HCC)   . MI (myocardial infarction) (HCC) 2015  . Pneumonia     Past Surgical History:  Procedure Laterality Date  . CARDIAC CATHETERIZATION  12/14   [facility]  . CHOLECYSTECTOMY    . INGUINAL HERNIA REPAIR Left 12/30/2017   Procedure: HERNIA REPAIR INGUINAL ADULT;  Surgeon: Earline Mayotte, MD;  Location: [facility] ORS;  Service: General;  Laterality: Left;  . PROSTATE ABLATION      Family History  Problem Relation Age of Onset  . Heart disease Father     Social History Social History   Tobacco Use  . Smoking status: Former Smoker    Types: Cigarettes  . Smokeless tobacco: Former Neurosurgeon    Types: Chew  Substance Use Topics  . Alcohol use: No  . Drug use: No    Allergies  Allergen Reactions  . Codeine Other (See Comments)    dizziness    Current Outpatient Medications  Medication Sig Dispense Refill  . acetaminophen (TYLENOL) 500 MG tablet Take 500 mg by mouth every 4 (four) hours as needed for moderate pain.     Marland Kitchen albuterol (PROVENTIL HFA;VENTOLIN HFA) 108 (90 Base) MCG/ACT inhaler Inhale 1-2 puffs into the lungs every 6 (six) hours as needed for wheezing or shortness of breath. 1 Inhaler 1  . aspirin 81 MG tablet Take 1 tablet (81 mg total) by mouth  daily. 30 tablet 3  . benzonatate (TESSALON PERLES) 100 MG capsule Take 1 capsule (100 mg total) by mouth 3 (three) times daily as needed for cough (Take 1-2 per dose). 30 capsule 0  . carvedilol (COREG) 6.25 MG tablet TAKE 1 TABLET twice a day (Patient taking differently: Take 6.25 mg by mouth 2 (two) times daily with a meal. TAKE 1 TABLET twice a day) 180 tablet 3  . cetirizine (ZYRTEC) 10 MG tablet Take 10 mg by mouth daily.    . Cholecalciferol 1000 units tablet Take 1,000 Units by mouth daily.     . furosemide (LASIX) 20 MG tablet Take 1 tablet (20 mg total) by mouth daily. 90 tablet 3  . losartan (COZAAR) 50 MG tablet TAKE 1 TABLET (50 MG TOTAL) BY MOUTH DAILY. (Patient taking differently: Take 50 mg by mouth daily. TAKE 1 TABLET (50 MG TOTAL) BY MOUTH DAILY.) 90 tablet 3  . nitroGLYCERIN (NITROSTAT) 0.4 MG SL tablet Place 1 tablet (0.4 mg total) under the tongue every 5 (five) minutes as needed for chest pain. 25 tablet 0  . polyethylene glycol (MIRALAX / GLYCOLAX) packet Take 17 g by mouth daily.    . simvastatin (ZOCOR) 20 MG tablet Take 1 tablet (20 mg total) by mouth daily. 90 tablet 3  No current facility-administered medications for this visit.     Review of Systems Review of Systems  Constitutional: Negative.   Respiratory: Negative.   Cardiovascular: Negative.     Blood pressure 113/74, pulse 72, resp. rate 13, height 6' (1.829 m), weight 191 lb (86.6 kg).  Physical Exam Physical Exam  Constitutional: He is oriented to person, place, and time. He appears well-developed and well-nourished.  Abdominal:  Left inguinal hernia repair intact and healing well.   Genitourinary:     Neurological: He is alert and oriented to person, place, and time.  Skin: Skin is warm and dry.    Data Reviewed Left inguinal hernia with incarcerated omentum.  Assessment    Doing well post inguinal hernia repair.    Plan  Proper lifting techniques reviewed.  He may drive when he  is pain-free and comfortable and confident in his ability to control the car.  We discussed his decreased reaction time based on age.  Return as needed. The patient is aware to call back for any questions or concerns. Resume activities as tolerated. May start back medications.   HPI, Physical Exam, Assessment and Plan have been scribed under the direction and in the presence of Donnalee Curry, MD.  Ples Specter, CMA  Merrily Pew Byrnett 01/05/2018, 9:08 PM

## 2018-01-05 NOTE — Patient Instructions (Addendum)
Return as needed. The patient is aware to call back for any questions or concerns. Resume activities as tolerated. May start back medications.

## 2018-01-07 ENCOUNTER — Encounter: Payer: Medicare Other | Admitting: General Surgery

## 2018-02-23 ENCOUNTER — Encounter: Payer: Self-pay | Admitting: Cardiovascular Disease

## 2018-03-20 ENCOUNTER — Ambulatory Visit: Payer: Medicare Other

## 2018-03-20 ENCOUNTER — Encounter: Payer: Self-pay | Admitting: Emergency Medicine

## 2018-03-20 ENCOUNTER — Ambulatory Visit
Admission: EM | Admit: 2018-03-20 | Discharge: 2018-03-20 | Disposition: A | Discharge status: Home / Self Care | Payer: Medicare Other | Attending: Family Medicine | Admitting: Family Medicine

## 2018-03-20 ENCOUNTER — Other Ambulatory Visit: Payer: Self-pay

## 2018-03-20 DIAGNOSIS — R059 Cough, unspecified: Secondary | ICD-10-CM

## 2018-03-20 DIAGNOSIS — Z8249 Family history of ischemic heart disease and other diseases of the circulatory system: Secondary | ICD-10-CM | POA: Insufficient documentation

## 2018-03-20 DIAGNOSIS — R05 Cough: Secondary | ICD-10-CM | POA: Insufficient documentation

## 2018-03-20 DIAGNOSIS — Z7982 Long term (current) use of aspirin: Secondary | ICD-10-CM | POA: Diagnosis not present

## 2018-03-20 DIAGNOSIS — I5022 Chronic systolic (congestive) heart failure: Secondary | ICD-10-CM | POA: Diagnosis not present

## 2018-03-20 DIAGNOSIS — Z79899 Other long term (current) drug therapy: Secondary | ICD-10-CM | POA: Diagnosis not present

## 2018-03-20 DIAGNOSIS — E785 Hyperlipidemia, unspecified: Secondary | ICD-10-CM | POA: Insufficient documentation

## 2018-03-20 DIAGNOSIS — I2510 Atherosclerotic heart disease of native coronary artery without angina pectoris: Secondary | ICD-10-CM | POA: Diagnosis not present

## 2018-03-20 DIAGNOSIS — I130 Hypertensive heart and chronic kidney disease with heart failure and stage 1 through stage 4 chronic kidney disease, or unspecified chronic kidney disease: Secondary | ICD-10-CM | POA: Diagnosis not present

## 2018-03-20 DIAGNOSIS — Z87891 Personal history of nicotine dependence: Secondary | ICD-10-CM | POA: Diagnosis not present

## 2018-03-20 DIAGNOSIS — N183 Chronic kidney disease, stage 3 (moderate): Secondary | ICD-10-CM | POA: Diagnosis not present

## 2018-03-20 DIAGNOSIS — I252 Old myocardial infarction: Secondary | ICD-10-CM | POA: Insufficient documentation

## 2018-03-20 LAB — COMPREHENSIVE METABOLIC PANEL
ALT: 12 U/L (ref 0–44)
AST: 25 U/L (ref 15–41)
Albumin: 3.4 g/dL — ABNORMAL LOW (ref 3.5–5.0)
Alkaline Phosphatase: 66 U/L (ref 38–126)
Anion gap: 10 (ref 5–15)
BUN: 29 mg/dL — ABNORMAL HIGH (ref 8–23)
CALCIUM: 8.8 mg/dL — AB (ref 8.9–10.3)
CO2: 24 mmol/L (ref 22–32)
Chloride: 99 mmol/L (ref 98–111)
Creatinine, Ser: 2.16 mg/dL — ABNORMAL HIGH (ref 0.61–1.24)
GFR calc non Af Amer: 26 mL/min — ABNORMAL LOW (ref >60)
GFR, EST AFRICAN AMERICAN: 30 mL/min — AB (ref >60)
Glucose, Bld: 140 mg/dL — ABNORMAL HIGH (ref 70–99)
POTASSIUM: 4 mmol/L (ref 3.5–5.1)
SODIUM: 133 mmol/L — AB (ref 135–145)
Total Bilirubin: 0.9 mg/dL (ref 0.3–1.2)
Total Protein: 7.1 g/dL (ref 6.5–8.1)

## 2018-03-20 LAB — CBC WITH DIFFERENTIAL/PLATELET
Abs Immature Granulocytes: 0.05 10*3/uL (ref 0.00–0.07)
BASOS PCT: 0 %
Basophils Absolute: 0 10*3/uL (ref 0.0–0.1)
EOS PCT: 0 %
Eosinophils Absolute: 0 10*3/uL (ref 0.0–0.5)
HCT: 44.2 % (ref 39.0–52.0)
Hemoglobin: 14.5 g/dL (ref 13.0–17.0)
Immature Granulocytes: 0 %
Lymphocytes Relative: 10 %
Lymphs Abs: 1.1 10*3/uL (ref 0.7–4.0)
MCH: 32.2 pg (ref 26.0–34.0)
MCHC: 32.8 g/dL (ref 30.0–36.0)
MCV: 98.2 fL (ref 80.0–100.0)
MONO ABS: 1.2 10*3/uL — AB (ref 0.1–1.0)
Monocytes Relative: 11 %
NEUTROS ABS: 9.3 10*3/uL — AB (ref 1.7–7.7)
Neutrophils Relative %: 79 %
PLATELETS: 219 10*3/uL (ref 150–400)
RBC: 4.5 MIL/uL (ref 4.22–5.81)
RDW: 14.4 % (ref 11.5–15.5)
WBC: 11.8 10*3/uL — AB (ref 4.0–10.5)
nRBC: 0 % (ref 0.0–0.2)

## 2018-03-20 LAB — BRAIN NATRIURETIC PEPTIDE: B Natriuretic Peptide: 78 pg/mL (ref 0.0–100.0)

## 2018-03-20 NOTE — Discharge Instructions (Signed)
Labs stable.  Chest xray stable.  Call Cardiology and Pulmonology for an appt.  Take care  Dr. Adriana Simas

## 2018-03-20 NOTE — ED Triage Notes (Signed)
Patient c/o cough and congestion that started 2 months ago. Patient was given Symbicort to started on 02/23/18 and improved but now states over the last few days he has gotten worse.

## 2018-03-20 NOTE — ED Provider Notes (Signed)
[facility]    CSN: [id number] Arrival date & time: 03/20/18  [id number]  History   Chief Complaint Chief Complaint  Patient presents with  . Cough   HPI  84 year old male presents with persistent cough.  Patient's wife states that he has been coughing for nearly 2 years.  He coughs up thick, clear sputum.  Has been worse since Christmas Eve.  No reports of fever.  No reports of shortness of breath.  Patient has known congestive heart failure.  He has not had an echocardiogram since 2016.  Last echo revealed an ejection fraction of 30 to 35%.  His wife states that she feels like he is getting weaker.  He is having more difficulty with ambulation.  Decrease in appetite.  Seems to be more fatigued.  Denies PND or orthopnea.  He was recently given Symbicort for his symptoms on 12/3.  Has not improved.  He has not seen pulmonology.  He has had no other follow-up with his cardiologist since September.  No other reported symptoms.  No other complaints.  Past Medical History:  Diagnosis Date  . CHF (congestive heart failure) (HCC)   . Chronic kidney disease, stage III (moderate) (HCC)   . Hyperlipidemia   . Hypertension   . Inguinal hernia, left 2019  . Kidney disease, chronic, stage III (GFR 30-59 ml/min) (HCC)   . MI (myocardial infarction) (HCC) 2015  . Pneumonia    Patient Active Problem List   Diagnosis Date Noted  . Incarcerated inguinal hernia 12/25/2017  . CAD (coronary artery disease) 03/01/2013  . Hyperlipidemia 03/01/2013  . Essential hypertension 03/01/2013  . Chronic renal insufficiency, stage II (mild) 03/01/2013  . Chronic systolic congestive heart failure (HCC) 03/01/2013    Past Surgical History:  Procedure Laterality Date  . CARDIAC CATHETERIZATION  12/14   [facility]  . CHOLECYSTECTOMY    . INGUINAL HERNIA REPAIR Left 12/30/2017   Procedure: HERNIA REPAIR INGUINAL ADULT;  Surgeon: Earline MayotteByrnett, Jeffrey W, MD;  Location: [facility] ORS;  Service: General;  Laterality:  Left;  . PROSTATE ABLATION      Home Medications    Prior to Admission medications   Medication Sig Start Date End Date Taking? Authorizing Provider  acetaminophen (TYLENOL) 500 MG tablet Take 500 mg by mouth every 4 (four) hours as needed for moderate pain.    Yes [provider]  albuterol (PROVENTIL HFA;VENTOLIN HFA) 108 (90 Base) MCG/ACT inhaler Inhale 1-2 puffs into the lungs every 6 (six) hours as needed for wheezing or shortness of breath. 12/21/17  Yes Antonieta IbaGollan, Timothy J, MD  aspirin 81 MG tablet Take 1 tablet (81 mg total) by mouth daily. 04/01/13  Yes Nahser, Deloris PingPhilip J, MD  benzonatate (TESSALON PERLES) 100 MG capsule Take 1 capsule (100 mg total) by mouth 3 (three) times daily as needed for cough (Take 1-2 per dose). 08/22/14  Yes Menshew, Charlesetta IvoryJenise V Bacon, PA-C  budesonide-formoterol (SYMBICORT) 160-4.5 MCG/ACT inhaler Inhale 2 puffs into the lungs 2 (two) times daily.   Yes [provider]  carvedilol (COREG) 6.25 MG tablet TAKE 1 TABLET twice a day Patient taking differently: Take 6.25 mg by mouth 2 (two) times daily with a meal. TAKE 1 TABLET twice a day 12/21/17  Yes Gollan, Tollie Pizzaimothy J, MD  cetirizine (ZYRTEC) 10 MG tablet Take 10 mg by mouth daily.   Yes [provider]  Cholecalciferol 1000 units tablet Take 1,000 Units by mouth daily.    Yes [provider]  furosemide (LASIX) 20  MG tablet Take 1 tablet (20 mg total) by mouth daily. 12/21/17  Yes Gollan, Tollie Pizza, MD  losartan (COZAAR) 50 MG tablet TAKE 1 TABLET (50 MG TOTAL) BY MOUTH DAILY. Patient taking differently: Take 50 mg by mouth daily. TAKE 1 TABLET (50 MG TOTAL) BY MOUTH DAILY. 12/21/17  Yes Gollan, Tollie Pizza, MD  nitroGLYCERIN (NITROSTAT) 0.4 MG SL tablet Place 1 tablet (0.4 mg total) under the tongue every 5 (five) minutes as needed for chest pain. 12/21/17  Yes Gollan, Tollie Pizza, MD  polyethylene glycol (MIRALAX / GLYCOLAX) packet Take 17 g by mouth daily.   Yes [provider]    simvastatin (ZOCOR) 20 MG tablet Take 1 tablet (20 mg total) by mouth daily. 12/21/17  Yes Antonieta Iba, MD   Family History Family History  Problem Relation Age of Onset  . Heart disease Father    Social History Social History   Tobacco Use  . Smoking status: Former Smoker    Types: Cigarettes  . Smokeless tobacco: Former Neurosurgeon    Types: Chew  Substance Use Topics  . Alcohol use: No  . Drug use: No   Allergies   Codeine  Review of Systems Review of Systems  Constitutional: Positive for appetite change and fatigue. Negative for fever.  Respiratory: Positive for cough.    Physical Exam Triage Vital Signs ED Triage Vitals  Enc Vitals Group     BP 03/20/18 0834 92/62     Pulse Rate 03/20/18 0834 100     Resp 03/20/18 0834 18     Temp 03/20/18 0834 98.3 F (36.8 C)     Temp Source 03/20/18 0834 Oral     SpO2 03/20/18 0834 96 %     Weight 03/20/18 0832 190 lb (86.2 kg)     Height 03/20/18 0832 6' (1.829 m)     Head Circumference --      Peak Flow --      Pain Score 03/20/18 0832 0     Pain Loc --      Pain Edu? --      Excl. in GC? --    Updated Vital Signs BP 92/62 (BP Location: Left Arm)   Pulse 100   Temp 98.3 F (36.8 C) (Oral)   Resp 18   Ht 6' (1.829 m)   Wt 86.2 kg   SpO2 96%   BMI 25.77 kg/m   Visual Acuity Right Eye Distance:   Left Eye Distance:   Bilateral Distance:    Right Eye Near:   Left Eye Near:    Bilateral Near:     Physical Exam Vitals signs and nursing note reviewed.  Constitutional:      General: He is not in acute distress. HENT:     Head: Normocephalic and atraumatic.     Nose: Nose normal.     Mouth/Throat:     Pharynx: Oropharynx is clear. No posterior oropharyngeal erythema.  Eyes:     General: No scleral icterus.    Conjunctiva/sclera: Conjunctivae normal.  Cardiovascular:     Rate and Rhythm: Normal rate and regular rhythm.  Pulmonary:     Effort: Pulmonary effort is normal.     Comments: Basilar  crackles. Neurological:     General: No focal deficit present.     Mental Status: He is alert.  Psychiatric:        Mood and Affect: Mood normal.        Behavior: Behavior normal.    UC  Treatments / Results  Labs (all labs ordered are listed, but only abnormal results are displayed) Labs Reviewed  CBC WITH DIFFERENTIAL/PLATELET - Abnormal; Notable for the following components:      Result Value   WBC 11.8 (*)    Neutro Abs 9.3 (*)    Monocytes Absolute 1.2 (*)    All other components within normal limits  COMPREHENSIVE METABOLIC PANEL - Abnormal; Notable for the following components:   Sodium 133 (*)    Glucose, Bld 140 (*)    BUN 29 (*)    Creatinine, Ser 2.16 (*)    Calcium 8.8 (*)    Albumin 3.4 (*)    GFR calc non Af Amer 26 (*)    GFR calc Af Amer 30 (*)    All other components within normal limits  BRAIN NATRIURETIC PEPTIDE    EKG None  Radiology Dg Chest 2 View  Result Date: 03/20/2018 CLINICAL DATA:  84 year old with cough. EXAM: CHEST - 2 VIEW COMPARISON:  08/22/2014 FINDINGS: Reticular densities at the left lung base are stable and probably represent scarring. Cardiac silhouette is stable. Subtle densities along the lateral right chest probably represent overlying bones and difficult to exclude old right rib fractures. No new airspace disease or pulmonary edema. No large pleural effusions. Degenerative changes in lower thoracic spine. IMPRESSION: Chronic changes without acute findings. Electronically Signed   By: Richarda Overlie M.D.   On: 03/20/2018 09:51    Procedures Procedures (including critical care time)  Medications Ordered in UC Medications - No data to display  Initial Impression / Assessment and Plan / UC Course  I have reviewed the triage vital signs and the nursing notes.  Pertinent labs & imaging results that were available during my care of the patient were reviewed by me and considered in my medical decision making (see chart for details).     84 year old male presents with chronic cough.  Laboratory studies essentially at baseline.  Chest x-ray with no acute findings.  Patient needs repeat echocardiogram and needs to see pulmonary given persistent symptoms.  I have placed a referral.  Advised wife to call on Monday.  Final Clinical Impressions(s) / UC Diagnoses   Final diagnoses:  Cough     Discharge Instructions     Labs stable.  Chest xray stable.  Call Cardiology and Pulmonology for an appt.  Take care  Dr. Adriana Simas     ED Prescriptions    None     Controlled Substance Prescriptions [place] Controlled Substance Registry consulted? Not Applicable   Tommie Sams, DO 03/20/18 1035

## 2018-03-22 ENCOUNTER — Ambulatory Visit (INDEPENDENT_AMBULATORY_CARE_PROVIDER_SITE_OTHER): Payer: Medicare Other | Admitting: Cardiovascular Disease

## 2018-03-22 ENCOUNTER — Encounter: Payer: Self-pay | Admitting: Cardiovascular Disease

## 2018-03-22 ENCOUNTER — Telehealth: Payer: Self-pay | Admitting: Cardiovascular Disease

## 2018-03-22 VITALS — BP 90/50 | HR 93 | Ht 72.0 in | Wt 198.0 lb

## 2018-03-22 DIAGNOSIS — I5022 Chronic systolic (congestive) heart failure: Secondary | ICD-10-CM | POA: Diagnosis not present

## 2018-03-22 DIAGNOSIS — I25118 Atherosclerotic heart disease of native coronary artery with other forms of angina pectoris: Secondary | ICD-10-CM

## 2018-03-22 DIAGNOSIS — N183 Chronic kidney disease, stage 3 unspecified: Secondary | ICD-10-CM

## 2018-03-22 DIAGNOSIS — R0602 Shortness of breath: Secondary | ICD-10-CM | POA: Diagnosis not present

## 2018-03-22 DIAGNOSIS — E7849 Other hyperlipidemia: Secondary | ICD-10-CM

## 2018-03-22 DIAGNOSIS — I10 Essential (primary) hypertension: Secondary | ICD-10-CM | POA: Diagnosis not present

## 2018-03-22 MED ORDER — LEVOFLOXACIN 500 MG PO TABS: 500.0000 mg | ORAL_TABLET | Freq: Every day | ORAL | 0 refills | Status: DC

## 2018-03-22 NOTE — Patient Instructions (Signed)
Medication Instructions:  Please start levaquin one a day for 10 days for bronchitis  If you need a refill on your cardiac medications before your next appointment, please call your pharmacy.    Lab work: No new labs needed   If you have labs (blood work) drawn today and your tests are completely normal, you will receive your results only by: Marland Kitchen MyChart Message (if you have MyChart) OR . A paper copy in the mail If you have any lab test that is abnormal or we need to change your treatment, we will call you to review the results.   Testing/Procedures: We will order an echocardiogram for shortness of breath and cardiomyopathy   Follow-Up: At [facility], you and your health needs are our priority.  As part of our continuing mission to provide you with exceptional heart care, we have created designated Provider Care Teams.  These Care Teams include your primary Cardiologist (physician) and Advanced Practice Providers (APPs -  Physician Assistants and Nurse Practitioners) who all work together to provide you with the care you need, when you need it.  . You will need a follow up appointment in 1 month   . Providers on your designated Care Team:   . Nicolasa Ducking, NP . Eula Listen, PA-C . Marisue Ivan, PA-C  Any Other Special Instructions Will Be Listed Below (If Applicable).  For educational health videos Log in to : [email or web address] Or : [email or web address], password : [email or web address]

## 2018-03-22 NOTE — Telephone Encounter (Signed)
Call returned to the patient's son, per the dpr. He stated that the patient is still having trouble with breathing and shortness of breath after his ED visit this weekend.   Per Dr. Mariah Milling, the patient may take furosemide 40 mg bid for the next three days and then resume the 20 mg daily. The son stated that he felt like the patient may already be taking the increased dose but was not sure. He stated that he would feel better if the patient could be seen today.   Per Dr. Mariah Milling, the patient may come in today at 3 to see him for an appointment. The son has verbalized his understanding.

## 2018-03-22 NOTE — Progress Notes (Signed)
Cardiology Office Note  Date:  03/22/2018   ID:  Samuel Mcbride Clerk, DOB [birth year], MRN [id number]  PCP:  Abram SanderAdamo, Elena M, MD   Chief Complaint  Patient presents with  . Other    Patient denies chest pain and SOB at this time. Meds reviewed verbally with patient.     HPI:  Volney AmericanCarrington Mcbride Bossard is a 84 y.o. male with PMH of CAD , cath 2014, occluded large D2 at the ostium, CHF CRI, stage III  CR 1.5 Hyperlipidemia ejection fraction of 30-35% by echo 07/06/2014 Who presents for follow up of his CAD  We received a phone call today concerning for worsening respiratory status On today's visit he reported worsening shortness of breath, Significant cough, keeping him up at night Reports having mouthfuls of phlegm in the morning, decorticating it out, sticky No significant color to it Feels very weak, son had to help him to get him around Denies any lower extremity edema, no abdominal bloating, no change in weight Has not been eating or drinking much as he does not feel well  [facility] for similar symptoms Hospital records reviewed with the patient in detail BNP 78, creatinine above his baseline 2.16 with elevated BUN Chest x-ray clear  Blood pressure running low on today's visit 90/50 Continues to take carvedilol and losartan with Lasix 20 daily Denies any near syncope  EKG personally reviewed by myself on todays visit Shows sinus rhythm rate 93 bpm right bundle branch block left axis deviation  other past medical history reviewed echocardiogram 02/25/2013: 1. Left ventricular ejection fraction, by visual estimation, is 30 to  35%.  2. Moderately to severely decreased global left ventricular systolic  function.  3. Entire apex, mid and apical anterior wall, mid and apical anterior  septum, and mid anterolateral segment are abnormal.  4. Mildly dilated left atrium.  5. Mild mitral annular dilatation.  6. Mild aortic valve sclerosis  without stenosis.   Cardiac catheterization 02/25/2013: Severe 1vessel CAD with occluded large D2 at the ostium, likely culprit for MI. Faint left to left collaterals.    PMH:   has a past medical history of CHF (congestive heart failure) (HCC), Chronic kidney disease, stage III (moderate) (HCC), Hyperlipidemia, Hypertension, Inguinal hernia, left (2019), Kidney disease, chronic, stage III (GFR 30-59 ml/min) (HCC), MI (myocardial infarction) (HCC) (2015), and Pneumonia.  PSH:    Past Surgical History:  Procedure Laterality Date  . CARDIAC CATHETERIZATION  12/14   [facility]  . CHOLECYSTECTOMY    . INGUINAL HERNIA REPAIR Left 12/30/2017   Procedure: HERNIA REPAIR INGUINAL ADULT;  Surgeon: Earline MayotteByrnett, Jeffrey W, MD;  Location: [facility] ORS;  Service: General;  Laterality: Left;  . PROSTATE ABLATION      Current Outpatient Medications  Medication Sig Dispense Refill  . acetaminophen (TYLENOL) 500 MG tablet Take 500 mg by mouth every 4 (four) hours as needed for moderate pain.     Marland Kitchen. albuterol (PROVENTIL HFA;VENTOLIN HFA) 108 (90 Base) MCG/ACT inhaler Inhale 1-2 puffs into the lungs every 6 (six) hours as needed for wheezing or shortness of breath. 1 Inhaler 1  . aspirin 81 MG tablet Take 1 tablet (81 mg total) by mouth daily. 30 tablet 3  . benzonatate (TESSALON PERLES) 100 MG capsule Take 1 capsule (100 mg total) by mouth 3 (three) times daily as needed for cough (Take 1-2 per dose). 30 capsule 0  . budesonide-formoterol (SYMBICORT) 160-4.5 MCG/ACT inhaler Inhale 2 puffs into the lungs 2 (  two) times daily.    . carvedilol (COREG) 6.25 MG tablet TAKE 1 TABLET twice a day (Patient taking differently: Take 6.25 mg by mouth 2 (two) times daily with a meal. TAKE 1 TABLET twice a day) 180 tablet 3  . cetirizine (ZYRTEC) 10 MG tablet Take 10 mg by mouth daily.    . Cholecalciferol 1000 units tablet Take 1,000 Units by mouth daily.     . clopidogrel (PLAVIX) 75 MG tablet Take 75 mg by mouth once.    .  furosemide (LASIX) 20 MG tablet Take 1 tablet (20 mg total) by mouth daily. 90 tablet 3  . losartan (COZAAR) 50 MG tablet TAKE 1 TABLET (50 MG TOTAL) BY MOUTH DAILY. (Patient taking differently: Take 50 mg by mouth daily. TAKE 1 TABLET (50 MG TOTAL) BY MOUTH DAILY.) 90 tablet 3  . montelukast (SINGULAIR) 10 MG tablet Take 10 mg by mouth at bedtime.    . nitroGLYCERIN (NITROSTAT) 0.4 MG SL tablet Place 1 tablet (0.4 mg total) under the tongue every 5 (five) minutes as needed for chest pain. 25 tablet 0  . polyethylene glycol (MIRALAX / GLYCOLAX) packet Take 17 g by mouth daily.    . simvastatin (ZOCOR) 20 MG tablet Take 1 tablet (20 mg total) by mouth daily. 90 tablet 3  . levofloxacin (LEVAQUIN) 500 MG tablet Take 1 tablet (500 mg total) by mouth daily. 10 tablet 0   No current facility-administered medications for this visit.      Allergies:   Codeine   Social History:  The patient  reports that he has quit smoking. His smoking use included cigarettes. He has quit using smokeless tobacco.  His smokeless tobacco use included chew. He reports that he does not drink alcohol or use drugs.   Family History:   family history includes Heart disease in his father.    Review of Systems: Review of Systems  Constitutional: Positive for malaise/fatigue.  Respiratory: Positive for cough, sputum production and shortness of breath.   Cardiovascular: Negative.   Gastrointestinal: Negative.   Musculoskeletal: Negative.        Mild gait instability  Neurological: Negative.   Psychiatric/Behavioral: Negative.   All other systems reviewed and are negative.   PHYSICAL EXAM: VS:  BP (!) 90/50 (BP Location: Left Arm, Patient Position: Sitting, Cuff Size: Normal)   Pulse 93   Ht 6' (1.829 m)   Wt 198 lb (89.8 kg)   BMI 26.85 kg/m  , BMI Body mass index is 26.85 kg/m. Constitutional:  oriented to person, place, and time. No distress.  HENT:  Head: Grossly normal Eyes:  no discharge. No scleral  icterus.  Neck: No JVD, no carotid bruits  Cardiovascular: Regular rate and rhythm, no murmurs appreciated Pulmonary/Chest: Scattered Rales otherwise clear , no wheezes  Abdominal: Soft.  no distension.  no tenderness.  Musculoskeletal: Normal range of motion Neurological:  normal muscle tone. Coordination normal. No atrophy Skin: Skin warm and dry Psychiatric: normal affect, pleasant   Recent Labs: 03/20/2018: ALT 12; B Natriuretic Peptide 78.0; BUN 29; Creatinine, Ser 2.16; Hemoglobin 14.5; Platelets 219; Potassium 4.0; Sodium 133    Lipid Panel Lab Results  Component Value Date   CHOL 113 06/15/2015   HDL 42 06/15/2015   LDLCALC 50 06/15/2015   TRIG 103 06/15/2015      Wt Readings from Last 3 Encounters:  03/22/18 198 lb (89.8 kg)  03/20/18 190 lb (86.2 kg)  01/05/18 191 lb (86.6 kg)  ASSESSMENT AND PLAN:  Respiratory distress Consistent with bronchitis BNP is low, chest x-ray relatively clear, renal function actually looking prerenal No leg edema, no abdominal bloating,  Unable to weigh for accurate weight today, difficulty standing His cough today with heavy sputum more consistent with bronchospasm and bronchitis Recommend he start Levaquin 500 mg for 10 days Also suggested Mucinex Echocardiogram ordered to confirm no changes in ejection fraction and to estimate right heart pressures   Coronary artery disease of native artery of native heart with stable angina pectoris (HCC) - Plan: EKG 12-Lead Currently with no symptoms of angina. No further workup at this time. Continue current medication regimen.  Essential hypertension Blood pressure low Possibly secondary to underlying infection and poor fluid intake, creatinine above his baseline as well as elevated BUN Recommended he hold the losartan If blood pressure continues to run low may need to decrease the carvedilol  Other hyperlipidemia Numbers at goal, no medication changes made  Chronic systolic  congestive heart failure (HCC) - Plan: EKG 12-Lead Echocardiogram ordered Current presentation more consistent with bronchitis  Less likely CHF BNP low, creatinine BUN elevated, no clinical signs concerning for CHF  Chronic renal insufficiency, stage III Creatinine greater than 2 Suggesting prerenal state Echo pending to confirm  Long period of time spent with family discussing differential of his symptoms Discussed each of the results in detail Discussed various treatment options including antibiotics New prescription sent in Discussed follow-up plan if symptoms do not get better  Total encounter time more than 45 minutes  Greater than 50% was spent in counseling and coordination of care with the patient   Disposition:   F/U  1 month   Orders Placed This Encounter  Procedures  . EKG 12-Lead  . ECHOCARDIOGRAM COMPLETE     Signed, Dossie Arbour, M.D., Ph.D. 03/22/2018  [facility] [place], [place] [phone]

## 2018-03-22 NOTE — Telephone Encounter (Signed)
Patient son calling Patient was seen in the ED this weekend and states that health is declining rapidly Would like to schedule an appointment but declines next available with R. Dunn on 1/7 and next available with Dr Mariah Milling on 1/9 Would like to speak with nurse on what steps to take  Please call to discuss

## 2018-03-23 ENCOUNTER — Telehealth: Payer: Self-pay | Admitting: *Deleted

## 2018-03-23 NOTE — Telephone Encounter (Signed)
-----   Message from Antonieta Iba, MD sent at 03/22/2018  8:53 PM EST ----- Regarding: Low blood pressure Can we recommend that he hold his losartan for low blood pressure and worsening kidney function Was just seen in the clinic today, had low blood pressure 90/50 Would wait until he is feeling better and bronchitis has resolved before restarting Would also recheck blood pressure before he restarts to make sure there is enough room on blood pressure  Thx TGollan

## 2018-03-23 NOTE — Telephone Encounter (Signed)
Patient returning call.

## 2018-03-23 NOTE — Telephone Encounter (Signed)
Spoke with patient and he requested that I speak with his wife. Reviewed Dr. Windell Hummingbird recommendations and she states that his blood pressure at 4:30 pm was 97/56 with heart rate of 75. Instructed her to hold the losartan and continue monitoring blood pressures and call me if any questions or concerns. She verbalized understanding, agreement with plan, and no further concerns at this time.

## 2018-03-23 NOTE — Telephone Encounter (Signed)
No answer. No voicemail. 

## 2018-03-26 ENCOUNTER — Ambulatory Visit (INDEPENDENT_AMBULATORY_CARE_PROVIDER_SITE_OTHER): Payer: Medicare Other

## 2018-03-26 DIAGNOSIS — R0602 Shortness of breath: Secondary | ICD-10-CM | POA: Diagnosis not present

## 2018-03-29 ENCOUNTER — Other Ambulatory Visit: Payer: Self-pay

## 2018-03-29 ENCOUNTER — Telehealth: Payer: Self-pay

## 2018-03-29 NOTE — Telephone Encounter (Signed)
*  STAT* If patient is at the pharmacy, call can be transferred to refill team.   1. Which medications need to be refilled? (please list name of each medication and dose if known)  Levaquin  2. Which pharmacy/location (including street and city if local pharmacy) is medication to be sent to? [facility] [place]  3. Do they need a 30 day or 90 day supply?

## 2018-03-29 NOTE — Telephone Encounter (Signed)
Call to give results of Echo, spoke to son [name], okay DPR.  He verbalized understanding. Has follow up this week with Mariah Milling, MD.  No further questions at this time.  Advised pt to call for any further questions or concerns

## 2018-03-29 NOTE — Telephone Encounter (Signed)
If the patient feels like he needs more antibiotics, then he will need to follow up with his PCP.  Do not refill levaquin under cardiology.   Thank you!

## 2018-03-29 NOTE — Telephone Encounter (Signed)
Please advise if ok to refill not cardiac medication. 

## 2018-03-29 NOTE — Telephone Encounter (Signed)
-----   Message from Antonieta Iba, MD sent at 03/28/2018 11:38 AM EST ----- Echo EF has improved since last study Previously in the 30 -35% range Now in the 45 to 50% range, Glad he is feeling better with Antibiotics. He had bronchitis

## 2018-04-02 NOTE — Telephone Encounter (Signed)
Reviewed Micromedex for potential side effects from levaquin.  Arthralgia is listed as a 0.1%-1% incidence for this drug.  I have called and spoken with Jeoffrey Massed, the patient's son (ok per DPR). Per [name], the patient finished his levaquin at the beginning of the week and has complained of some soreness to his hand/ wrist/ elbow/ shoulder over the last 2 days.  Discomfort is mostly left sided, but also some in the right wrist. The patient mentioned to his son he felt like his grip wasn't as strong when shaving.   I have advised [name] of what I found on Micromedex, and that there is a potential what he is experiencing may be coming from the levaquin, but that our office isn't as familiar with this as his PCP would be. I advised if symptoms are from the levaquin, these should start to resolve now that he is done with the medication. Per [name], the patient has a follow up appointment with his PCP next week.  The will discuss this further with her if symptoms are still persistent.

## 2018-04-02 NOTE — Telephone Encounter (Signed)
Patient son [name] calling States that patient is feeling better after the bronchitis but is now complaining of soreness in hand, elbow and shoulder Patient son would like to know if possibly the antibiotic could have made him sore  Please call to discuss

## 2018-04-12 ENCOUNTER — Telehealth: Payer: Self-pay | Admitting: Cardiovascular Disease

## 2018-04-12 NOTE — Telephone Encounter (Signed)
Patient son [name] calling Would like to discuss patients blood pressure with nurse Would like to know what blood pressure should he reach to indicate when to start taking medication again  Please call to discuss

## 2018-04-13 NOTE — Telephone Encounter (Signed)
Attempted to call patient. LMTCB 1/21 

## 2018-04-13 NOTE — Telephone Encounter (Signed)
Spoke with son. He does not have BP values at this time but reports that he will call in the am and give [place] the last 5 day values.

## 2018-04-13 NOTE — Telephone Encounter (Signed)
Patient son returning call

## 2018-04-14 NOTE — Telephone Encounter (Signed)
From: Ilda Mori [email or web address]> Sent: Tuesday, April 13, 2018 3:13 PM To: Dossie Arbour [email or web address]> Subject: SECURE- Patient Question  Hey Dr. Mariah Milling, My dad Samuel Mcbride) has called your office a couple of times, regarding my Grandfather Samuel Mcbride), and seems that you and him are playing phone tag. So, I thought I would send you a message and you could respond when you had a chance. Dad wanted to get your recommendation as to where you would like to see Grandpa's Samuel Mcbride) blood pressure at before starting his blood pressure medicine back. He did have an appointment with his primary care last Wednesday, and they would not give an answer to that question. Suggested that we as you. Thanks so much for your help! Luan Moore Brice  [facility] Medical Staff Coordinator Medical Staff Services Direct Dial: [phone] Fax: [phone]  Reply: Wed 04/14/2018 7:26 AM  Mariah Milling, Jeani Hawking;   So we received a call from patients son and he spoke with one of our other nurses yesterday. He did not have any blood pressure readings during that call and he stated that he would call back today with those readings.  Thanks Rinaldo Cloud A. RN

## 2018-04-16 NOTE — Telephone Encounter (Signed)
I made call to patients son requesting update on BP values. He reported that he does not have them at this time but his dad told him that BP has improved and doesn't have any current complaints.  Pt has f/u on 2/7 with Dr. Mariah Milling. I encouraged him to continue recording Bps and bring values to his appointment.  Son agreed that if BP goes up again or they have any other questions or concerns they will call back.  Encounter completed at this time.

## 2018-04-26 NOTE — Progress Notes (Signed)
Cardiology Office Note  Date:  04/30/2018   ID:  Samuel, Mcbride 1925/10/01, MRN [id number]  PCP:  Abram Sander, MD   Chief Complaint  Patient presents with  . other    1 month follow up. Meds reviewed by the pt. verbally. "doing well."     HPI:  Samuel Mcbride is a 85 y.o. male with PMH of CAD , cath 2014, occluded large D2 at the ostium, CHF CRI, stage III  CR 1.5 Hyperlipidemia ejection fraction of 30-35% by echo 07/06/2014 Ejection fraction 45 to 50% January 2020 Who presents for follow up of his CAD, respiratory distress  On his last clinic visit he had worsening respiratory status Significant cough, keeping him up at night  mouthfuls of phlegm , felt weak Blood pressure was running low Treated with Levaquin for bronchitis and reports feeling better  Echocardiogram was performed to rule out cardiac etiology with ejection fraction 45 to 50% up from 30 to 35% on prior study  Losartan held on prior clinic visit for hypotension in the setting of bronchitis He continues to have some mild cough but nothing severe Energy is back to normal Drinking Ensure 2 cans a day He started this when he was not feeling well when he had bronchitis and nutrition was poor but has continued to take 2 a day  Denies any significant shortness of breath or chest pain on exertion no PND orthopnea  Lab work reviewed from primary care total cholesterol 120 LDL 45 creatinine 2.0 BUN 31 glucose 99  Review of blood pressure numbers from home typically running low 100s, occasionally in the 90s  EKG personally reviewed by myself on todays visit Shows sinus rhythm rate 77 bpm right bundle branch block left axis deviation  other past medical history reviewed Echocardiogram January 2020 Left ventricle: The cavity size was normal. Systolic function was   mildly reduced. The estimated ejection fraction was in the range   of 45% to 50%. Mild diffuse hypokinesis, unable to exclude   lateral  wall hypokinesis Doppler parameters are consistent with   abnormal left ventricular relaxation (grade 1 diastolic   dysfunction). - Aorta: Aortic root is mildly dilated, dimension: 40 mm (ED).  Cardiac catheterization 02/25/2013: Severe 1 vessel CAD with occluded large D2 at the ostium, likely culprit for MI. Faint left to left collaterals.    PMH:   has a past medical history of CHF (congestive heart failure) (HCC), Chronic kidney disease, stage III (moderate) (HCC), Hyperlipidemia, Hypertension, Inguinal hernia, left (2019), Kidney disease, chronic, stage III (GFR 30-59 ml/min) (HCC), MI (myocardial infarction) (HCC) (2015), and Pneumonia.  PSH:    Past Surgical History:  Procedure Laterality Date  . CARDIAC CATHETERIZATION  12/14   [facility]  . CHOLECYSTECTOMY    . INGUINAL HERNIA REPAIR Left 12/30/2017   Procedure: HERNIA REPAIR INGUINAL ADULT;  Surgeon: Earline Mayotte, MD;  Location: [facility] ORS;  Service: General;  Laterality: Left;  . PROSTATE ABLATION      Current Outpatient Medications  Medication Sig Dispense Refill  . acetaminophen (TYLENOL) 500 MG tablet Take 500 mg by mouth every 4 (four) hours as needed for moderate pain.     Marland Kitchen albuterol (PROVENTIL HFA;VENTOLIN HFA) 108 (90 Base) MCG/ACT inhaler Inhale 1-2 puffs into the lungs every 6 (six) hours as needed for wheezing or shortness of breath. 1 Inhaler 1  . aspirin 81 MG tablet Take 1 tablet (81 mg total) by mouth daily. 30 tablet 3  . benzonatate (  TESSALON PERLES) 100 MG capsule Take 1 capsule (100 mg total) by mouth 3 (three) times daily as needed for cough (Take 1-2 per dose). 30 capsule 0  . budesonide-formoterol (SYMBICORT) 160-4.5 MCG/ACT inhaler Inhale 2 puffs into the lungs 2 (two) times daily.    . carvedilol (COREG) 6.25 MG tablet TAKE 1 TABLET twice a day (Patient taking differently: Take 6.25 mg by mouth 2 (two) times daily with a meal. TAKE 1 TABLET twice a day) 180 tablet 3  . cetirizine (ZYRTEC) 10 MG tablet  Take 10 mg by mouth daily.    . Cholecalciferol 1000 units tablet Take 1,000 Units by mouth daily.     . clopidogrel (PLAVIX) 75 MG tablet Take 75 mg by mouth once.    . furosemide (LASIX) 20 MG tablet Take 1 tablet (20 mg) by mouth once daily Monday-Friday (hold Saturday/ Sunday).    . montelukast (SINGULAIR) 10 MG tablet Take 10 mg by mouth at bedtime.    . nitroGLYCERIN (NITROSTAT) 0.4 MG SL tablet Place 1 tablet (0.4 mg total) under the tongue every 5 (five) minutes as needed for chest pain. 25 tablet 0  . polyethylene glycol (MIRALAX / GLYCOLAX) packet Take 17 g by mouth daily.    . simvastatin (ZOCOR) 20 MG tablet Take 1 tablet (20 mg total) by mouth daily. 90 tablet 3   No current facility-administered medications for this visit.      Allergies:   Codeine   Social History:  The patient  reports that he has quit smoking. His smoking use included cigarettes. He has quit using smokeless tobacco.  His smokeless tobacco use included chew. He reports that he does not drink alcohol or use drugs.   Family History:   family history includes Heart disease in his father.    Review of Systems: Review of Systems  Constitutional: Negative.   Respiratory: Positive for cough.   Cardiovascular: Negative.   Gastrointestinal: Negative.   Musculoskeletal: Negative.        Mild gait instability  Neurological: Negative.   Psychiatric/Behavioral: Negative.   All other systems reviewed and are negative.   PHYSICAL EXAM: VS:  BP 110/70 (BP Location: Left Arm, Patient Position: Sitting, Cuff Size: Normal)   Pulse 77   Ht 6' (1.829 m)   Wt 183 lb 12 oz (83.3 kg)   BMI 24.92 kg/m  , BMI Body mass index is 24.92 kg/m. Constitutional:  oriented to person, place, and time. No distress.  HENT:  Head: Grossly normal Eyes:  no discharge. No scleral icterus.  Neck: No JVD, no carotid bruits  Cardiovascular: Regular rate and rhythm, no murmurs appreciated Pulmonary/Chest: Clear to auscultation  bilaterally, scattered Rales on the left Abdominal: Soft.  no distension.  no tenderness.  Musculoskeletal: Normal range of motion Neurological:  normal muscle tone. Coordination normal. No atrophy Skin: Skin warm and dry Psychiatric: normal affect, pleasant   Recent Labs: 03/20/2018: ALT 12; B Natriuretic Peptide 78.0; BUN 29; Creatinine, Ser 2.16; Hemoglobin 14.5; Platelets 219; Potassium 4.0; Sodium 133    Lipid Panel Lab Results  Component Value Date   CHOL 113 06/15/2015   HDL 42 06/15/2015   LDLCALC 50 06/15/2015   TRIG 103 06/15/2015      Wt Readings from Last 3 Encounters:  04/30/18 183 lb 12 oz (83.3 kg)  03/22/18 198 lb (89.8 kg)  03/20/18 190 lb (86.2 kg)     ASSESSMENT AND PLAN:  Respiratory distress Consistent with bronchitis Better after antibiotics Still with  some scattered Rales on the left, slight cough Discussed return precautions with family and patient, recommended they call our office or [facility] for worsening cough, sputum, respiratory distress as he had previously  Coronary artery disease of native artery of native heart with stable angina pectoris (HCC) - Plan: EKG 12-Lead Currently with no symptoms of angina. No further workup at this time. Continue current medication regimen.  Improved ejection fraction on recent echocardiogram No ischemic work-up ordered  Essential hypertension Blood pressure low on last clinic visit and losartan was held Blood pressure continues to run low, creatinine elevated concerning for prerenal state Recommend he hold Lasix on weekends  Other hyperlipidemia Numbers at goal, no medication changes made  Chronic systolic congestive heart failure (HCC) - Plan: EKG 12-Lead Ejection fraction improved to 45 to 50% on echo Continue to hold losartan given creatinine 2.2 Hold Lasix on weekends  Chronic renal insufficiency, stage III Creatinine greater than 2 May need to increase fluid intake We have cut back on Lasix  on weekends   Total encounter time more than 25 minutes  Greater than 50% was spent in counseling and coordination of care with the patient   Disposition:   F/U  1 month   Orders Placed This Encounter  Procedures  . EKG 12-Lead     Signed, Dossie Arbourim Kamaury Cutbirth, M.D., Ph.D. 04/30/2018  [facility], [place] [phone]

## 2018-04-28 ENCOUNTER — Ambulatory Visit: Payer: Medicare Other | Admitting: Physician Assistant

## 2018-04-30 ENCOUNTER — Encounter: Payer: Self-pay | Admitting: Cardiovascular Disease

## 2018-04-30 ENCOUNTER — Ambulatory Visit (INDEPENDENT_AMBULATORY_CARE_PROVIDER_SITE_OTHER): Payer: Medicare Other | Admitting: Cardiovascular Disease

## 2018-04-30 VITALS — BP 110/70 | HR 77 | Ht 72.0 in | Wt 183.8 lb

## 2018-04-30 DIAGNOSIS — I5022 Chronic systolic (congestive) heart failure: Secondary | ICD-10-CM | POA: Diagnosis not present

## 2018-04-30 DIAGNOSIS — I25118 Atherosclerotic heart disease of native coronary artery with other forms of angina pectoris: Secondary | ICD-10-CM

## 2018-04-30 DIAGNOSIS — I10 Essential (primary) hypertension: Secondary | ICD-10-CM | POA: Diagnosis not present

## 2018-04-30 DIAGNOSIS — E7849 Other hyperlipidemia: Secondary | ICD-10-CM | POA: Diagnosis not present

## 2018-04-30 MED ORDER — FUROSEMIDE 20 MG PO TABS: ORAL_TABLET | ORAL | Status: DC

## 2018-04-30 NOTE — Patient Instructions (Addendum)
Medication Instructions:  - Your physician has recommended you make the following change in your medication:   1)  Decrease lasix (furosemide) 20 mg- take 1 tablet by mouth once daily Monday-Friday (do not take any on Saturday/ Sunday)   If you need a refill on your cardiac medications before your next appointment, please call your pharmacy.    Lab work: No new labs needed   If you have labs (blood work) drawn today and your tests are completely normal, you will receive your results only by: Marland Kitchen MyChart Message (if you have MyChart) OR . A paper copy in the mail If you have any lab test that is abnormal or we need to change your treatment, we will call you to review the results.   Testing/Procedures: No new testing needed   Follow-Up: At [facility], you and your health needs are our priority.  As part of our continuing mission to provide you with exceptional heart care, we have created designated Provider Care Teams.  These Care Teams include your primary Cardiologist (physician) and Advanced Practice Providers (APPs -  Physician Assistants and Nurse Practitioners) who all work together to provide you with the care you need, when you need it.  . You will need a follow up appointment in 12 months .   Please call our office 2 months in advance to schedule this appointment.    . Providers on your designated Care Team:   . Nicolasa Ducking, NP . Eula Listen, PA-C . Marisue Ivan, PA-C  Any Other Special Instructions Will Be Listed Below (If Applicable).  For educational health videos Log in to : [email or web address] Or : [email or web address], password : [email or web address]

## 2019-01-07 ENCOUNTER — Other Ambulatory Visit: Payer: Self-pay

## 2019-01-07 ENCOUNTER — Telehealth: Payer: Self-pay

## 2019-01-07 MED ORDER — CARVEDILOL 6.25 MG PO TABS: 6.2500 mg | ORAL_TABLET | Freq: Two times a day (BID) | ORAL | 0 refills | Status: DC

## 2019-01-07 MED ORDER — SIMVASTATIN 20 MG PO TABS: 20.0000 mg | ORAL_TABLET | Freq: Every day | ORAL | 0 refills | Status: DC

## 2019-01-07 NOTE — Telephone Encounter (Signed)
Refill sent for Simvastatin 20 mg.  

## 2019-03-01 ENCOUNTER — Telehealth: Payer: Self-pay | Admitting: Cardiovascular Disease

## 2019-03-01 NOTE — Telephone Encounter (Signed)
Spoke to son Mikki Santee and referred him to call PCP for further recommendations.   He verbalized understanding and will call them.

## 2019-03-01 NOTE — Telephone Encounter (Signed)
Patient wife states pt has a lot of cough and congestion. She states last year when this happened, he saw his PCP and they advised him to see Dr. Rockey Situ. Please call to discuss . Wife is not on DPR, I advised her and the contact number is to talk with  Patient son, Mikki Santee.

## 2019-03-03 ENCOUNTER — Other Ambulatory Visit: Payer: Self-pay

## 2019-03-03 MED ORDER — CLOPIDOGREL BISULFATE 75 MG PO TABS: 75.0000 mg | ORAL_TABLET | Freq: Once | ORAL | 0 refills | Status: DC

## 2019-03-03 MED ORDER — FUROSEMIDE 20 MG PO TABS: ORAL_TABLET | ORAL | 0 refills | Status: DC

## 2019-03-03 MED ORDER — CLOPIDOGREL BISULFATE 75 MG PO TABS: 75.0000 mg | ORAL_TABLET | Freq: Once | ORAL | 0 refills | Status: AC

## 2019-03-03 NOTE — Telephone Encounter (Signed)
*  STAT* If patient is at the pharmacy, call can be transferred to refill team.   1. Which medications need to be refilled? (please list name of each medication and dose if known) Plavix, Lasix  2. Which pharmacy/location (including street and city if local pharmacy) is medication to be sent to? [place]  3. Do they need a 30 day or 90 day supply? [place]

## 2019-03-21 ENCOUNTER — Other Ambulatory Visit: Payer: Self-pay

## 2019-03-21 ENCOUNTER — Emergency Department: Payer: Medicare Other

## 2019-03-21 ENCOUNTER — Emergency Department
Admission: EM | Admit: 2019-03-21 | Discharge: 2019-03-22 | Disposition: A | Discharge status: Home / Self Care | Payer: Medicare Other | Attending: Student in an Organized Health Care Education/Training Program | Admitting: Student in an Organized Health Care Education/Training Program

## 2019-03-21 DIAGNOSIS — N183 Chronic kidney disease, stage 3 unspecified: Secondary | ICD-10-CM | POA: Diagnosis not present

## 2019-03-21 DIAGNOSIS — Z7982 Long term (current) use of aspirin: Secondary | ICD-10-CM | POA: Insufficient documentation

## 2019-03-21 DIAGNOSIS — Z79899 Other long term (current) drug therapy: Secondary | ICD-10-CM | POA: Diagnosis not present

## 2019-03-21 DIAGNOSIS — R197 Diarrhea, unspecified: Secondary | ICD-10-CM

## 2019-03-21 DIAGNOSIS — I5022 Chronic systolic (congestive) heart failure: Secondary | ICD-10-CM | POA: Insufficient documentation

## 2019-03-21 DIAGNOSIS — R109 Unspecified abdominal pain: Secondary | ICD-10-CM | POA: Insufficient documentation

## 2019-03-21 DIAGNOSIS — Z87891 Personal history of nicotine dependence: Secondary | ICD-10-CM | POA: Diagnosis not present

## 2019-03-21 DIAGNOSIS — I130 Hypertensive heart and chronic kidney disease with heart failure and stage 1 through stage 4 chronic kidney disease, or unspecified chronic kidney disease: Secondary | ICD-10-CM | POA: Insufficient documentation

## 2019-03-21 DIAGNOSIS — R059 Cough, unspecified: Secondary | ICD-10-CM

## 2019-03-21 DIAGNOSIS — I2510 Atherosclerotic heart disease of native coronary artery without angina pectoris: Secondary | ICD-10-CM | POA: Insufficient documentation

## 2019-03-21 DIAGNOSIS — I252 Old myocardial infarction: Secondary | ICD-10-CM | POA: Insufficient documentation

## 2019-03-21 DIAGNOSIS — U071 COVID-19: Secondary | ICD-10-CM | POA: Diagnosis not present

## 2019-03-21 DIAGNOSIS — R05 Cough: Secondary | ICD-10-CM | POA: Insufficient documentation

## 2019-03-21 LAB — URINALYSIS, COMPLETE (UACMP) WITH MICROSCOPIC
Bacteria, UA: NONE SEEN
Bilirubin Urine: NEGATIVE
Glucose, UA: NEGATIVE mg/dL
Ketones, ur: 5 mg/dL — AB
Leukocytes,Ua: NEGATIVE
Nitrite: NEGATIVE
Protein, ur: 30 mg/dL — AB
Specific Gravity, Urine: 1.025 (ref 1.005–1.030)
pH: 5 (ref 5.0–8.0)

## 2019-03-21 LAB — CBC
HCT: 45.4 % (ref 39.0–52.0)
Hemoglobin: 15.2 g/dL (ref 13.0–17.0)
MCH: 31.8 pg (ref 26.0–34.0)
MCHC: 33.5 g/dL (ref 30.0–36.0)
MCV: 95 fL (ref 80.0–100.0)
Platelets: 335 10*3/uL (ref 150–400)
RBC: 4.78 MIL/uL (ref 4.22–5.81)
RDW: 13.2 % (ref 11.5–15.5)
WBC: 17.8 10*3/uL — ABNORMAL HIGH (ref 4.0–10.5)
nRBC: 0 % (ref 0.0–0.2)

## 2019-03-21 LAB — COMPREHENSIVE METABOLIC PANEL
ALT: 26 U/L (ref 0–44)
AST: 46 U/L — ABNORMAL HIGH (ref 15–41)
Albumin: 3.3 g/dL — ABNORMAL LOW (ref 3.5–5.0)
Alkaline Phosphatase: 77 U/L (ref 38–126)
Anion gap: 13 (ref 5–15)
BUN: 32 mg/dL — ABNORMAL HIGH (ref 8–23)
CO2: 24 mmol/L (ref 22–32)
Calcium: 9.6 mg/dL (ref 8.9–10.3)
Chloride: 101 mmol/L (ref 98–111)
Creatinine, Ser: 1.62 mg/dL — ABNORMAL HIGH (ref 0.61–1.24)
GFR calc Af Amer: 42 mL/min — ABNORMAL LOW (ref >60)
GFR calc non Af Amer: 37 mL/min — ABNORMAL LOW (ref >60)
Glucose, Bld: 125 mg/dL — ABNORMAL HIGH (ref 70–99)
Potassium: 4.3 mmol/L (ref 3.5–5.1)
Sodium: 138 mmol/L (ref 135–145)
Total Bilirubin: 1.2 mg/dL (ref 0.3–1.2)
Total Protein: 7.5 g/dL (ref 6.5–8.1)

## 2019-03-21 LAB — C DIFFICILE QUICK SCREEN W PCR REFLEX
C Diff antigen: NEGATIVE
C Diff interpretation: NOT DETECTED
C Diff toxin: NEGATIVE

## 2019-03-21 LAB — POC SARS CORONAVIRUS 2 AG: SARS Coronavirus 2 Ag: NEGATIVE

## 2019-03-21 LAB — LIPASE, BLOOD: Lipase: 18 U/L (ref 11–51)

## 2019-03-21 LAB — LACTIC ACID, PLASMA: Lactic Acid, Venous: 1.7 mmol/L (ref 0.5–1.9)

## 2019-03-21 MED ORDER — LEVOFLOXACIN 500 MG PO TABS
500.0000 mg | ORAL_TABLET | Freq: Once | ORAL | Status: AC
  Administered 2019-03-21: 23:00:00 500 mg via ORAL
  Filled 2019-03-21: qty 1

## 2019-03-21 MED ORDER — IOHEXOL 300 MG/ML  SOLN
75.0000 mL | Freq: Once | INTRAMUSCULAR | Status: AC | PRN
  Administered 2019-03-21: 75 mL via INTRAVENOUS

## 2019-03-21 MED ORDER — SODIUM CHLORIDE 0.9% FLUSH
3.0000 mL | Freq: Once | INTRAVENOUS | Status: AC
  Administered 2019-03-21: 19:00:00 3 mL via INTRAVENOUS

## 2019-03-21 MED ORDER — LEVOFLOXACIN 500 MG PO TABS: 250.0000 mg | ORAL_TABLET | Freq: Every day | ORAL | 0 refills | Status: AC

## 2019-03-21 MED ORDER — POLYETHYLENE GLYCOL 3350 17 G PO PACK: 17.0000 g | PACK | Freq: Every day | ORAL | 1 refills | Status: AC

## 2019-03-21 MED ORDER — SODIUM CHLORIDE 0.9 % IV BOLUS
250.0000 mL | Freq: Once | INTRAVENOUS | Status: AC
  Administered 2019-03-21: 250 mL via INTRAVENOUS

## 2019-03-21 NOTE — ED Notes (Signed)
First nurse note: Pt here from home via EMS with c/o diarrhea for the past day, about 15 times overnight per ems. Denies vomiting. 95% RA. HR 80's. In wheelchair in lobby, NAD.

## 2019-03-21 NOTE — ED Triage Notes (Signed)
Pt c/o dark/black watery diarrhea for the past 2 days, denies abd pain, denies N/V.Marland Kitchen pt is a/ox4 on arrival. In NAD, pt incontinent of stool on arrival

## 2019-03-21 NOTE — ED Provider Notes (Signed)
[facility] Emergency Department Provider Note    First MD Initiated Contact with Patient 03/21/19 1834     (approximate)  I have reviewed the triage vital signs and the nursing notes.   HISTORY  Chief Complaint Diarrhea    HPI NIRVAN LABAN is a 85 y.o. male the below listed past medical history presents the ER primarily due to concern for 2 episodes of watery dark stool that occurred today.  Patient denies any chest pain shortness of breath nausea or vomiting.  Not been on any recent antibiotics.  He states that he has had good appetite but some cold and states that he is lost taste and smell over the past week was concern for Covid.  Denies any back pain has had some intermittent abdominal discomfort.  No abdominal pain at this moment.    Past Medical History:  Diagnosis Date  . CHF (congestive heart failure) (HCC)   . Chronic kidney disease, stage III (moderate)   . Hyperlipidemia   . Hypertension   . Inguinal hernia, left 2019  . Kidney disease, chronic, stage III (GFR 30-59 ml/min)   . MI (myocardial infarction) (HCC) 2015  . Pneumonia    Family History  Problem Relation Age of Onset  . Heart disease Father    Past Surgical History:  Procedure Laterality Date  . CARDIAC CATHETERIZATION  12/14   [facility]  . CHOLECYSTECTOMY    . INGUINAL HERNIA REPAIR Left 12/30/2017   Procedure: HERNIA REPAIR INGUINAL ADULT;  Surgeon: Earline Mayotte, MD;  Location: [facility] ORS;  Service: General;  Laterality: Left;  . PROSTATE ABLATION     Patient Active Problem List   Diagnosis Date Noted  . Incarcerated inguinal hernia 12/25/2017  . CAD (coronary artery disease) 03/01/2013  . Hyperlipidemia 03/01/2013  . Essential hypertension 03/01/2013  . CRI (chronic renal insufficiency), stage 3 (moderate) (HCC) 03/01/2013  . Chronic systolic congestive heart failure (HCC) 03/01/2013      Prior to Admission medications   Medication Sig Start Date End Date  Taking? Authorizing Provider  acetaminophen (TYLENOL) 500 MG tablet Take 500 mg by mouth every 4 (four) hours as needed for moderate pain.     [provider]  albuterol (PROVENTIL HFA;VENTOLIN HFA) 108 (90 Base) MCG/ACT inhaler Inhale 1-2 puffs into the lungs every 6 (six) hours as needed for wheezing or shortness of breath. 12/21/17   Antonieta Iba, MD  aspirin 81 MG tablet Take 1 tablet (81 mg total) by mouth daily. 04/01/13   Nahser, Deloris Ping, MD  benzonatate (TESSALON PERLES) 100 MG capsule Take 1 capsule (100 mg total) by mouth 3 (three) times daily as needed for cough (Take 1-2 per dose). 08/22/14   Menshew, Charlesetta Ivory, PA-C  budesonide-formoterol (SYMBICORT) 160-4.5 MCG/ACT inhaler Inhale 2 puffs into the lungs 2 (two) times daily.    [provider]  carvedilol (COREG) 6.25 MG tablet Take 1 tablet (6.25 mg total) by mouth 2 (two) times daily with a meal. TAKE 1 TABLET twice a day 01/07/19   Antonieta Iba, MD  cetirizine (ZYRTEC) 10 MG tablet Take 10 mg by mouth daily.    [provider]  Cholecalciferol 1000 units tablet Take 1,000 Units by mouth daily.     [provider]  furosemide (LASIX) 20 MG tablet Take 1 tablet (20 mg) by mouth once daily Monday-Friday (hold Saturday/ Sunday). 03/03/19   Antonieta Iba, MD  levofloxacin (LEVAQUIN) 500 MG tablet Take 0.5  tablets (250 mg total) by mouth daily for 7 days. 03/21/19 03/28/19  Willy Eddy, MD  montelukast (SINGULAIR) 10 MG tablet Take 10 mg by mouth at bedtime.    [provider]  nitroGLYCERIN (NITROSTAT) 0.4 MG SL tablet Place 1 tablet (0.4 mg total) under the tongue every 5 (five) minutes as needed for chest pain. 12/21/17   Antonieta Iba, MD  polyethylene glycol (MIRALAX / GLYCOLAX) 17 g packet Take 17 g by mouth daily. Mix one tablespoon with 8oz of your favorite juice or water every day until you are having soft formed stools. Then start taking once daily if you didn't have  a stool the day before. 03/21/19   Willy Eddy, MD  simvastatin (ZOCOR) 20 MG tablet Take 1 tablet (20 mg total) by mouth daily. 01/07/19   Antonieta Iba, MD    Allergies Codeine    Social History Social History   Tobacco Use  . Smoking status: Former Smoker    Types: Cigarettes  . Smokeless tobacco: Former Neurosurgeon    Types: Chew  Substance Use Topics  . Alcohol use: No  . Drug use: No    Review of Systems Patient denies headaches, rhinorrhea, blurry vision, numbness, shortness of breath, chest pain, edema, cough, abdominal pain, nausea, vomiting, diarrhea, dysuria, fevers, rashes or hallucinations unless otherwise stated above in HPI. ____________________________________________   PHYSICAL EXAM:  VITAL SIGNS: Vitals:   03/21/19 2300 03/21/19 2311  BP: 103/62   Pulse: 78 87  Resp:  16  Temp:    SpO2: 93%     Constitutional: Alert and oriented. Non toxic appearing Eyes: Conjunctivae are normal.  Head: Atraumatic. Nose: No congestion/rhinnorhea. Mouth/Throat: Mucous membranes are moist.   Neck: No stridor. Painless ROM.  Cardiovascular: Normal rate, regular rhythm. Grossly normal heart sounds.  Good peripheral circulation. Respiratory: Normal respiratory effort.  No retractions. Lungs CTAB. Gastrointestinal: Soft and nontender. No distention. No abdominal bruits. No CVA tenderness. Genitourinary: scral decubitus ulcer without cellulitic changes or open wound,  Brown soft formed stool in rectal vault Musculoskeletal: No lower extremity tenderness nor edema.  No joint effusions. Neurologic:  Normal speech and language. No gross focal neurologic deficits are appreciated. No facial droop Skin:  Skin is warm, dry and intact. No rash noted. Psychiatric: Mood and affect are normal. Speech and behavior are normal.  ____________________________________________   LABS (all labs ordered are listed, but only abnormal results are displayed)  Results for orders  placed or performed during the hospital encounter of 03/21/19 (from the past 24 hour(s))  Lipase, blood     Status: None   Collection Time: 03/21/19  3:11 PM  Result Value Ref Range   Lipase 18 11 - 51 U/L  Comprehensive metabolic panel     Status: Abnormal   Collection Time: 03/21/19  3:11 PM  Result Value Ref Range   Sodium 138 135 - 145 mmol/L   Potassium 4.3 3.5 - 5.1 mmol/L   Chloride 101 98 - 111 mmol/L   CO2 24 22 - 32 mmol/L   Glucose, Bld 125 (H) 70 - 99 mg/dL   BUN 32 (H) 8 - 23 mg/dL   Creatinine, Ser 8.33 (H) 0.61 - 1.24 mg/dL   Calcium 9.6 8.9 - 82.5 mg/dL   Total Protein 7.5 6.5 - 8.1 g/dL   Albumin 3.3 (L) 3.5 - 5.0 g/dL   AST 46 (H) 15 - 41 U/L   ALT 26 0 - 44 U/L   Alkaline Phosphatase 77 38 -  126 U/L   Total Bilirubin 1.2 0.3 - 1.2 mg/dL   GFR calc non Af Amer 37 (L) >60 mL/min   GFR calc Af Amer 42 (L) >60 mL/min   Anion gap 13 5 - 15  CBC     Status: Abnormal   Collection Time: 03/21/19  3:11 PM  Result Value Ref Range   WBC 17.8 (H) 4.0 - 10.5 K/uL   RBC 4.78 4.22 - 5.81 MIL/uL   Hemoglobin 15.2 13.0 - 17.0 g/dL   HCT 45.4 39.0 - 52.0 %   MCV 95.0 80.0 - 100.0 fL   MCH 31.8 26.0 - 34.0 pg   MCHC 33.5 30.0 - 36.0 g/dL   RDW 13.2 11.5 - 15.5 %   Platelets 335 150 - 400 K/uL   nRBC 0.0 0.0 - 0.2 %  Urinalysis, Complete w Microscopic     Status: Abnormal   Collection Time: 03/21/19  3:11 PM  Result Value Ref Range   Color, Urine AMBER (A) YELLOW   APPearance CLEAR (A) CLEAR   Specific Gravity, Urine 1.025 1.005 - 1.030   pH 5.0 5.0 - 8.0   Glucose, UA NEGATIVE NEGATIVE mg/dL   Hgb urine dipstick MODERATE (A) NEGATIVE   Bilirubin Urine NEGATIVE NEGATIVE   Ketones, ur 5 (A) NEGATIVE mg/dL   Protein, ur 30 (A) NEGATIVE mg/dL   Nitrite NEGATIVE NEGATIVE   Leukocytes,Ua NEGATIVE NEGATIVE   RBC / HPF 0-5 0 - 5 RBC/hpf   WBC, UA 0-5 0 - 5 WBC/hpf   Bacteria, UA NONE SEEN NONE SEEN   Squamous Epithelial / LPF 0-5 0 - 5   Mucus PRESENT   Lactic acid,  plasma     Status: None   Collection Time: 03/21/19  6:59 PM  Result Value Ref Range   Lactic Acid, Venous 1.7 0.5 - 1.9 mmol/L  POC SARS Coronavirus 2 Ag     Status: None   Collection Time: 03/21/19  9:40 PM  Result Value Ref Range   SARS Coronavirus 2 Ag NEGATIVE NEGATIVE  C Difficile Quick Screen w PCR reflex     Status: None   Collection Time: 03/21/19  9:54 PM   Specimen: STOOL  Result Value Ref Range   C Diff antigen NEGATIVE NEGATIVE   C Diff toxin NEGATIVE NEGATIVE   C Diff interpretation No C. difficile detected.    ____________________________________________ ____________________________________________  RADIOLOGY  I personally reviewed all radiographic images ordered to evaluate for the above acute complaints and reviewed radiology reports and findings.  These findings were personally discussed with the patient.  Please see medical record for radiology report. ____________________________________________   PROCEDURES  Procedure(s) performed:  Procedures    Critical Care performed: no ____________________________________________   INITIAL IMPRESSION / ASSESSMENT AND PLAN / ED COURSE  Pertinent labs & imaging results that were available during my care of the patient were reviewed by me and considered in my medical decision making (see chart for details).   DDX: colitis, diverticulitis, cdif, infectious colitis, ibd, viral illness, pna, uri, covid, electrolyte abn  CORNEL WERBER is a 85 y.o. who presents to the ED with symptoms as described above.  Patient nontoxic appearing hemodynamically stable.  No respiratory distress.  Given his symptoms blood work sent for above differential does show mild leukocytosis and given his complaints will order CT imaging to exclude diverticulitis or other acute intra-abdominal process.  Will check for Covid is that also remains a possibility given his loss of smell and taste.  Clinical Course as of Mar 20 2320  Mon Mar 21, 2019  2004 Nurse was notified based on the patient did take 2 laxatives today.  Has been observed in the ER now 6 hours and not had any additional diarrheal illness.  Does have mild leukocytosis.  Son reports that he had lost smell and taste a week ago but his rapid antigen is negative.  If his CT imaging is reassuring does not show any evidence of persistent diarrheal illness I believe patient be appropriate for discharge home   [PR]  2112 Rectal exam with moderate stool burden and was disimpacted.  No melena or hematochezia.  Does have decubitus ulcers.  Guaiac was faintly positive and I think it was more likely secondary to decub and surrounding skin breakdown than GI bleed given his stable hemoglobin.  Discussed ct imaging with patient and consulted with Dr. Wyn Quakerew regarding aortic ectasia.  Patient already on asa.  No indication for additional anticoagulation at this time.  Has not had any additional diarrheal episodes.  Not c/w infectious colitis.  Will send off covid test as ag negative.     [PR]  2231 Chest x-ray does show infiltrate.  I did recommend admission to the hospital given his URI symptoms and concern for pneumonia.  Patient declined this.  Discussed my concern of pneumonia in the setting of 85 years old with his comorbidities but patient states that he feels well and adamantly does not want to be admitted to the hospital.  Demonstrates understanding of the risks.  Certainly appears to have capacity make that decision.  Discussed signs and symptoms for which he should return to the ER.   [PR]    Clinical Course User Index [PR] Willy Eddyobinson, Creig Landin, MD    The patient was evaluated in Emergency Department today for the symptoms described in the history of present illness. He/she was evaluated in the context of the global COVID-19 pandemic, which necessitated consideration that the patient might be at risk for infection with the SARS-CoV-2 virus that causes COVID-19. Institutional protocols and  algorithms that pertain to the evaluation of patients at risk for COVID-19 are in a state of rapid change based on information released by regulatory bodies including the CDC and federal and state organizations. These policies and algorithms were followed during the patient's care in the ED.  As part of my medical decision making, I reviewed the following data within the electronic MEDICAL RECORD NUMBER Nursing notes reviewed and incorporated, Labs reviewed, notes from prior ED visits and [place] Controlled Substance Database   ____________________________________________   FINAL CLINICAL IMPRESSION(S) / ED DIAGNOSES  Final diagnoses:  Diarrhea, unspecified type  Cough      NEW MEDICATIONS STARTED DURING THIS VISIT:  New Prescriptions   LEVOFLOXACIN (LEVAQUIN) 500 MG TABLET    Take 0.5 tablets (250 mg total) by mouth daily for 7 days.   POLYETHYLENE GLYCOL (MIRALAX / GLYCOLAX) 17 G PACKET    Take 17 g by mouth daily. Mix one tablespoon with 8oz of your favorite juice or water every day until you are having soft formed stools. Then start taking once daily if you didn't have a stool the day before.     Note:  This document was prepared using Dragon voice recognition software and may include unintentional dictation errors.    Willy Eddyobinson, Venicia Vandall, MD 03/21/19 [id number]

## 2019-03-21 NOTE — ED Notes (Addendum)
MD at the bedside to check hemoccult

## 2019-03-21 NOTE — Discharge Instructions (Addendum)
Please follow up with PCP for recheck.  As we discussed, I recommended admission to the Hospital given your age for IV antibiotics and monitoring.  You have stated you want to be discharged home, which is certainly your right.  Please return to the ER should you start feeling worse or don't feel that you are improving on the antibiotics I have prescribed.

## 2019-03-21 NOTE — ED Notes (Addendum)
Pt lying in bed in NAD. Pt asked to use the restroom and was assisted by this RN, pt ambulated with standby assistance. Pt had a smear like bowel movement in the toilet and mild incontinence of bowels in his brief.

## 2019-03-21 NOTE — ED Notes (Signed)
Pt was assisted to the restroom, pt had a small liquid BM/ sample was sent to the lab for further analysis

## 2019-03-22 LAB — SARS CORONAVIRUS 2 (TAT 6-24 HRS): SARS Coronavirus 2: POSITIVE — AB

## 2019-03-22 NOTE — ED Notes (Signed)
E-signature pad not working. Pt and son educated about discharge instructions and verbalized understanding.

## 2019-03-23 ENCOUNTER — Telehealth: Payer: Self-pay | Admitting: Nurse Practitioner

## 2019-03-23 NOTE — Telephone Encounter (Signed)
Called to Discuss with patient about Covid symptoms and the use of bamlanivimab, a monoclonal antibody infusion for those with mild to moderate Covid symptoms and at a high risk of hospitalization.     Patient's symptoms started over 10 days and therefore would not meet criteria for infusion.

## 2019-03-24 LAB — GI PATHOGEN PANEL BY PCR, STOOL

## 2019-03-28 ENCOUNTER — Emergency Department
Admission: EM | Admit: 2019-03-28 | Discharge: 2019-03-28 | Disposition: A | Discharge status: Home / Self Care | Payer: Medicare Other | Attending: Emergency Medicine | Admitting: Emergency Medicine

## 2019-03-28 ENCOUNTER — Other Ambulatory Visit: Payer: Self-pay

## 2019-03-28 ENCOUNTER — Emergency Department: Payer: Medicare Other

## 2019-03-28 DIAGNOSIS — N183 Chronic kidney disease, stage 3 unspecified: Secondary | ICD-10-CM | POA: Insufficient documentation

## 2019-03-28 DIAGNOSIS — Z7982 Long term (current) use of aspirin: Secondary | ICD-10-CM | POA: Insufficient documentation

## 2019-03-28 DIAGNOSIS — I2510 Atherosclerotic heart disease of native coronary artery without angina pectoris: Secondary | ICD-10-CM | POA: Diagnosis not present

## 2019-03-28 DIAGNOSIS — Z79899 Other long term (current) drug therapy: Secondary | ICD-10-CM | POA: Insufficient documentation

## 2019-03-28 DIAGNOSIS — I5022 Chronic systolic (congestive) heart failure: Secondary | ICD-10-CM | POA: Insufficient documentation

## 2019-03-28 DIAGNOSIS — U071 COVID-19: Secondary | ICD-10-CM | POA: Diagnosis not present

## 2019-03-28 DIAGNOSIS — K5900 Constipation, unspecified: Secondary | ICD-10-CM | POA: Insufficient documentation

## 2019-03-28 DIAGNOSIS — I130 Hypertensive heart and chronic kidney disease with heart failure and stage 1 through stage 4 chronic kidney disease, or unspecified chronic kidney disease: Secondary | ICD-10-CM | POA: Insufficient documentation

## 2019-03-28 DIAGNOSIS — Z87891 Personal history of nicotine dependence: Secondary | ICD-10-CM | POA: Insufficient documentation

## 2019-03-28 NOTE — ED Provider Notes (Signed)
[facility] Emergency Department Provider Note   ____________________________________________    I have reviewed the triage vital signs and the nursing notes.   HISTORY  Chief Complaint Constipation     HPI Samuel Mcbride is a 86 y.o. male with history of CHF, CKD, hypertension who presents with reports of constipation.  Patient reports he is having to strain to have a bowel movement and has pain in his rectum as if there is hard stool there.  Denies abdominal pain.  No nausea or vomiting.  Currently is feeling well.  Recent diagnosis of novel coronavirus, seen in the emergency department recently had CT scan, stool studies which were overall reassuring.  Past Medical History:  Diagnosis Date  . CHF (congestive heart failure) (HCC)   . Chronic kidney disease, stage III (moderate)   . Hyperlipidemia   . Hypertension   . Inguinal hernia, left 2019  . Kidney disease, chronic, stage III (GFR 30-59 ml/min)   . MI (myocardial infarction) (HCC) 2015  . Pneumonia     Patient Active Problem List   Diagnosis Date Noted  . Incarcerated inguinal hernia 12/25/2017  . CAD (coronary artery disease) 03/01/2013  . Hyperlipidemia 03/01/2013  . Essential hypertension 03/01/2013  . CRI (chronic renal insufficiency), stage 3 (moderate) (HCC) 03/01/2013  . Chronic systolic congestive heart failure (HCC) 03/01/2013    Past Surgical History:  Procedure Laterality Date  . CARDIAC CATHETERIZATION  12/14   [facility]  . CHOLECYSTECTOMY    . INGUINAL HERNIA REPAIR Left 12/30/2017   Procedure: HERNIA REPAIR INGUINAL ADULT;  Surgeon: Earline Mayotte, MD;  Location: [facility] ORS;  Service: General;  Laterality: Left;  . PROSTATE ABLATION      Prior to Admission medications   Medication Sig Start Date End Date Taking? Authorizing Provider  acetaminophen (TYLENOL) 500 MG tablet Take 500 mg by mouth every 4 (four) hours as needed for moderate pain.     [provider]  albuterol (PROVENTIL HFA;VENTOLIN HFA) 108 (90 Base) MCG/ACT inhaler Inhale 1-2 puffs into the lungs every 6 (six) hours as needed for wheezing or shortness of breath. 12/21/17   Antonieta Iba, MD  aspirin 81 MG tablet Take 1 tablet (81 mg total) by mouth daily. 04/01/13   Nahser, Deloris Ping, MD  benzonatate (TESSALON PERLES) 100 MG capsule Take 1 capsule (100 mg total) by mouth 3 (three) times daily as needed for cough (Take 1-2 per dose). 08/22/14   Menshew, Charlesetta Ivory, PA-C  budesonide-formoterol (SYMBICORT) 160-4.5 MCG/ACT inhaler Inhale 2 puffs into the lungs 2 (two) times daily.    [provider]  carvedilol (COREG) 6.25 MG tablet Take 1 tablet (6.25 mg total) by mouth 2 (two) times daily with a meal. TAKE 1 TABLET twice a day 01/07/19   Antonieta Iba, MD  cetirizine (ZYRTEC) 10 MG tablet Take 10 mg by mouth daily.    [provider]  Cholecalciferol 1000 units tablet Take 1,000 Units by mouth daily.     [provider]  furosemide (LASIX) 20 MG tablet Take 1 tablet (20 mg) by mouth once daily Monday-Friday (hold Saturday/ Sunday). 03/03/19   Antonieta Iba, MD  levofloxacin (LEVAQUIN) 500 MG tablet Take 0.5 tablets (250 mg total) by mouth daily for 7 days. 03/21/19 03/28/19  Willy Eddy, MD  montelukast (SINGULAIR) 10 MG tablet Take 10 mg by mouth at bedtime.    [provider]  nitroGLYCERIN (NITROSTAT) 0.4 MG SL tablet Place  1 tablet (0.4 mg total) under the tongue every 5 (five) minutes as needed for chest pain. 12/21/17   Minna Merritts, MD  polyethylene glycol (MIRALAX / GLYCOLAX) 17 g packet Take 17 g by mouth daily. Mix one tablespoon with 8oz of your favorite juice or water every day until you are having soft formed stools. Then start taking once daily if you didn't have a stool the day before. 03/21/19   Merlyn Lot, MD  simvastatin (ZOCOR) 20 MG tablet Take 1 tablet (20 mg total) by mouth daily. 01/07/19   Minna Merritts, MD     Allergies Codeine  Family History  Problem Relation Age of Onset  . Heart disease Father     Social History Social History   Tobacco Use  . Smoking status: Former Smoker    Types: Cigarettes  . Smokeless tobacco: Former Systems developer    Types: Chew  Substance Use Topics  . Alcohol use: No  . Drug use: No    Review of Systems  Constitutional: Denies fever   Cardiovascular: Denies chest pain. Respiratory: No cough Gastrointestinal: No abdominal pain, rectal discomfort as described above   Skin: Negative for rash. Neurological: No headaches   ____________________________________________   PHYSICAL EXAM:  VITAL SIGNS: ED Triage Vitals  Enc Vitals Group     BP 03/28/19 0940 111/64     Pulse Rate 03/28/19 0940 76     Resp 03/28/19 0940 16     Temp 03/28/19 0940 97.9 F (36.6 C)     Temp Source 03/28/19 0940 Oral     SpO2 03/28/19 0940 97 %     Weight 03/28/19 0942 78.5 kg (173 lb)     Height 03/28/19 0942 1.829 m (6')     Head Circumference --      Peak Flow --      Pain Score 03/28/19 0941 10     Pain Loc --      Pain Edu? --      Excl. in [employer]? --     Constitutional: Alert and oriented. No acute distress.  Pleasant and interactive   Mouth/Throat: Mucous membranes are moist.    Cardiovascular: Normal rate, regular rhythm.   Good peripheral circulation. Respiratory: Normal respiratory effort.  No retractions. Gastrointestinal: Soft and nontender. No distention.  No CVA tenderness.  Rectal exam without hard stool, no abnormalities of the anus noted.  Overall quite reassuring exam  Musculoskeletal:  Warm and well perfused Neurologic:  Normal speech and language. No gross focal neurologic deficits are appreciated.  Skin:  Skin is warm, dry and intact. No rash noted. Psychiatric: Mood and affect are normal. Speech and behavior are normal.  ____________________________________________   LABS (all labs ordered are listed, but only abnormal  results are displayed)  Labs Reviewed - No data to display ____________________________________________  EKG  None ____________________________________________  RADIOLOGY  KUB no fecal impaction mild stool burden ____________________________________________   PROCEDURES  Procedure(s) performed: No  Procedures   Critical Care performed: No ____________________________________________   INITIAL IMPRESSION / ASSESSMENT AND PLAN / ED COURSE  Pertinent labs & imaging results that were available during my care of the patient were reviewed by me and considered in my medical decision making (see chart for details).  Patient well-appearing and in no acute distress, is quite comfortable, abdominal exam is very reassuring.  No fecal impaction on rectal exam, KUB is also reassuring with no abnormal gas patterns.  Reviewed CT from 1228.  Stool studies were  negative from that time as well.  ----------------------------------------- 1:36 PM on 03/28/2019 -----------------------------------------  On reexam patient remains quite comfortable, no abdominal tenderness to palpation.  He states he is asymptomatic at this time.  Discussed at length with patient's son who is frustrated that this is the patient's second visit in 1 week.  I strongly urged follow-up with GI and noted that novel coronavirus can cause GI symptoms.  Patient is quite comfortable with being discharged as he feels well.  No indication for further testing at this time    ____________________________________________   FINAL CLINICAL IMPRESSION(S) / ED DIAGNOSES  Final diagnoses:  Constipation, unspecified constipation type        Note:  This document was prepared using Dragon voice recognition software and may include unintentional dictation errors.   Jene Every, MD 03/28/19 [phone]

## 2019-03-28 NOTE — ED Triage Notes (Addendum)
Pt was positive for covid 12/28 had issues with diarrhea 03/19/11/28, now not able to have a BM and is having rectal pain and pressures, states he has taken 1 dose of miralax with no relief, states he did pass a small amount of hard stool this morning. Pt is in NAD>

## 2019-03-29 ENCOUNTER — Ambulatory Visit: Payer: Medicare Other | Admitting: Gastroenterology

## 2019-04-07 ENCOUNTER — Other Ambulatory Visit: Payer: Self-pay

## 2019-04-07 ENCOUNTER — Ambulatory Visit (INDEPENDENT_AMBULATORY_CARE_PROVIDER_SITE_OTHER): Payer: Medicare Other | Admitting: Gastroenterology

## 2019-04-07 ENCOUNTER — Encounter: Payer: Self-pay | Admitting: Gastroenterology

## 2019-04-07 VITALS — BP 96/64 | HR 99 | Temp 97.4°F | Ht 72.0 in | Wt 173.4 lb

## 2019-04-07 DIAGNOSIS — K6289 Other specified diseases of anus and rectum: Secondary | ICD-10-CM

## 2019-04-07 DIAGNOSIS — R194 Change in bowel habit: Secondary | ICD-10-CM

## 2019-04-07 NOTE — Progress Notes (Signed)
Gastroenterology Consultation  Referring Provider:     Frazier Richards, MD Primary Care Physician:  Frazier Richards, MD Primary Gastroenterologist:  Dr. Allen Norris     Reason for Consultation:     Diarrhea and constipation        HPI:   Samuel Mcbride is a 86 y.o. y/o male referred for consultation & management of diarrhea and constipation by Dr. Sherril Cong, Hattie Perch, MD.  This patient was in the emergency room on 12/28 with a report of diarrhea.  The patient was found to have a moderate stool burden and a report of a painful rectal exam.  After leaving the the ER the patient went home and was reporting constipation.  The constipation was quite severe and the patient was reporting rectal pain.  On 4 January the patient was in the ER again for constipation with a KUB not showing any significant stool burden.  After a few days the patient was asymptomatic and not having any further rectal pain or constipation.  The patient was found to be COVID positive in the ER.   The patient reports that his rectal pain was his main concern but now he states that he is moving his bowels more frequently and he is taking MiraLAX.  He also states that he has been having less and less pain as the days go by.  He is not having any rectal bleeding or unexplained weight loss.  The patient also denies any abdominal pain. The patient's to ER visit notes and imaging were reviewed.  Past Medical History:  Diagnosis Date  . CHF (congestive heart failure) ([place])   . Chronic kidney disease, stage III (moderate)   . Hyperlipidemia   . Hypertension   . Inguinal hernia, left 2019  . Kidney disease, chronic, stage III (GFR 30-59 ml/min)   . MI (myocardial infarction) ([place]) 2015  . Pneumonia     Past Surgical History:  Procedure Laterality Date  . CARDIAC CATHETERIZATION  12/14   [facility]  . CHOLECYSTECTOMY    . INGUINAL HERNIA REPAIR Left 12/30/2017   Procedure: HERNIA REPAIR INGUINAL ADULT;  Surgeon: Robert Bellow, MD;   Location: [facility] ORS;  Service: General;  Laterality: Left;  . PROSTATE ABLATION      Prior to Admission medications   Medication Sig Start Date End Date Taking? Authorizing Provider  acetaminophen (TYLENOL) 500 MG tablet Take 500 mg by mouth every 4 (four) hours as needed for moderate pain.     [provider]  albuterol (PROVENTIL HFA;VENTOLIN HFA) 108 (90 Base) MCG/ACT inhaler Inhale 1-2 puffs into the lungs every 6 (six) hours as needed for wheezing or shortness of breath. 12/21/17   Minna Merritts, MD  aspirin 81 MG tablet Take 1 tablet (81 mg total) by mouth daily. 04/01/13   Nahser, Wonda Cheng, MD  benzonatate (TESSALON PERLES) 100 MG capsule Take 1 capsule (100 mg total) by mouth 3 (three) times daily as needed for cough (Take 1-2 per dose). 08/22/14   Menshew, Dannielle Karvonen, PA-C  budesonide-formoterol (SYMBICORT) 160-4.5 MCG/ACT inhaler Inhale 2 puffs into the lungs 2 (two) times daily.    [provider]  carvedilol (COREG) 6.25 MG tablet Take 1 tablet (6.25 mg total) by mouth 2 (two) times daily with a meal. TAKE 1 TABLET twice a day 01/07/19   Minna Merritts, MD  cetirizine (ZYRTEC) 10 MG tablet Take 10 mg by mouth daily.    [provider]  Cholecalciferol  1000 units tablet Take 1,000 Units by mouth daily.     [provider]  furosemide (LASIX) 20 MG tablet Take 1 tablet (20 mg) by mouth once daily Monday-Friday (hold Saturday/ Sunday). 03/03/19   Antonieta Iba, MD  montelukast (SINGULAIR) 10 MG tablet Take 10 mg by mouth at bedtime.    [provider]  nitroGLYCERIN (NITROSTAT) 0.4 MG SL tablet Place 1 tablet (0.4 mg total) under the tongue every 5 (five) minutes as needed for chest pain. 12/21/17   Antonieta Iba, MD  polyethylene glycol (MIRALAX / GLYCOLAX) 17 g packet Take 17 g by mouth daily. Mix one tablespoon with 8oz of your favorite juice or water every day until you are having soft formed stools. Then start taking once daily  if you didn't have a stool the day before. 03/21/19   Willy Eddy, MD  simvastatin (ZOCOR) 20 MG tablet Take 1 tablet (20 mg total) by mouth daily. 01/07/19   Antonieta Iba, MD    Family History  Problem Relation Age of Onset  . Heart disease Father      Social History   Tobacco Use  . Smoking status: Former Smoker    Types: Cigarettes  . Smokeless tobacco: Former Neurosurgeon    Types: Chew  Substance Use Topics  . Alcohol use: No  . Drug use: No    Allergies as of 04/07/2019 - Review Complete 03/28/2019  Allergen Reaction Noted  . Codeine Other (See Comments) 03/01/2013    Review of Systems:    All systems reviewed and negative except where noted in HPI.   Physical Exam:  There were no vitals taken for this visit. No LMP for male patient. General:   Alert,  Well-developed, well-nourished, pleasant and cooperative in NAD Head:  Normocephalic and atraumatic. Eyes:  Sclera clear, no icterus.   Conjunctiva pink. Ears:  Normal auditory acuity. Neck:  Supple; no masses or thyromegaly. Lungs:  Respirations even and unlabored.  Clear throughout to auscultation.   No wheezes, crackles, or rhonchi. No acute distress. Heart:  Regular rate and rhythm; no murmurs, clicks, rubs, or gallops. Abdomen:  Normal bowel sounds.  No bruits.  Soft, non-tender and non-distended without masses, hepatosplenomegaly or hernias noted.  No guarding or rebound tenderness.  Negative Carnett sign.   Rectal:  Deferred.  Pulses:  Normal pulses noted. Extremities:  No clubbing or edema.  No cyanosis. Neurologic:  Alert and oriented x3;  grossly normal neurologically. Skin:  Intact without significant lesions or rashes.  No jaundice. Lymph Nodes:  No significant cervical adenopathy. Psych:  Alert and cooperative. Normal mood and affect.  Imaging Studies: DG Abdomen 1 View  Result Date: 03/28/2019 CLINICAL DATA:  Constipation EXAM: ABDOMEN - 1 VIEW COMPARISON:  None. FINDINGS: Bowel gas pattern is  unremarkable. Stool burden is mild. Cholecystectomy clips are present. Degenerative changes of the lumbar spine. IMPRESSION: Unremarkable bowel gas pattern.  Mild stool burden. Electronically Signed   By: Guadlupe Spanish M.D.   On: 03/28/2019 11:17   CT ABDOMEN PELVIS W CONTRAST  Result Date: 03/21/2019 CLINICAL DATA:  Diarrhea. Cholecystectomy. Diverticulitis suspected. EXAM: CT ABDOMEN AND PELVIS WITH CONTRAST TECHNIQUE: Multidetector CT imaging of the abdomen and pelvis was performed using the standard protocol following bolus administration of intravenous contrast. CONTRAST:  59mL OMNIPAQUE IOHEXOL 300 MG/ML  SOLN COMPARISON:  12/24/2017 FINDINGS: Lower chest: Bibasilar scarring. Normal heart size without pericardial or pleural effusion. Tiny hiatal hernia. Hepatobiliary: Low-density right hepatic lobe lesions are too  small to characterize but likely cysts. Cholecystectomy, without biliary ductal dilatation. Pancreas: Normal pancreas for age, Without duct dilatation or acute inflammation. Spleen: Normal in size, without focal abnormality. Adrenals/Urinary Tract: Right greater than left adrenal thickening. Mild renal cortical thinning bilaterally. Normal urinary bladder. Stomach/Bowel: Normal remainder of the stomach. Transverse duodenal diverticulum. Moderate amount of stool within the rectum, including at 5.4 cm. Scattered colonic diverticula. Normal terminal ileum and appendix. Otherwise normal small bowel. Vascular/Lymphatic: Aortic and branch vessel atherosclerosis. Infrarenal abdominal aortic focal dilatation including at up to 3.1 cm on 30/2. Extensive wall thrombus at this level, eccentric right. Measures 3.0 cm on 12/23/2017. No abdominopelvic adenopathy. Reproductive: Mild prostatomegaly with probable TURP defect. Other: No significant free fluid. Presumed left inguinal hernia repair, with soft tissue plug on 68/2. Small fat containing right inguinal hernia. Musculoskeletal: Degenerate disc disease  at the lumbosacral junction. IMPRESSION: 1. Moderate stool in the rectum, suggesting fecal impaction. 2. No evidence of colitis or other explanation for diarrhea. 3. Mild increase in focal infrarenal abdominal aortic ectasia with extensive wall thrombus within. 4.  Tiny hiatal hernia. 5.  Aortic Atherosclerosis (ICD10-I70.0). Electronically Signed   By: Jeronimo Greaves M.D.   On: 03/21/2019 20:20   DG Chest Portable 1 View  Result Date: 03/21/2019 CLINICAL DATA:  Leukocytosis.  Rule out pneumonia EXAM: PORTABLE CHEST 1 VIEW COMPARISON:  03/20/2018 FINDINGS: Mild right lower lobe airspace disease, new since the prior study. Mild left lower lobe airspace disease also new. No heart failure or effusion. IMPRESSION: Mild bibasilar atelectasis/infiltrate.  Possible pneumonia. Electronically Signed   By: Marlan Palau M.D.   On: 03/21/2019 22:00    Assessment and Plan:   TREGAN READ is a 86 y.o. y/o male who comes in today with a history of being seen in the ER and likely had overflow diarrhea from his constipation and he has been explained this.  The constipation then caused him to have rectal pain due to a anal fissure with his history of burning in his rectum and pain in addition to the constipation.  He states it is getting better and I do not think exacerbating it with a rectal exam at this time is prudent.  He also reports that he has taking MiraLAX that he has been told to titrate that up and down so that he does not get constipated and has a bowel movement either once a day or once every other day.  He has been told that if he has any rectal bleeding worsening of the pain or any other issues he should contact me immediately.  The patient and his family member have been explained the plan and agree with it.    Midge Minium, MD. Clementeen Graham    Note: This dictation was prepared with Dragon dictation along with smaller phrase technology. Any transcriptional errors that result from this process are  unintentional.

## 2019-04-20 ENCOUNTER — Telehealth: Payer: Self-pay

## 2019-04-20 ENCOUNTER — Telehealth: Payer: Self-pay | Admitting: Cardiovascular Disease

## 2019-04-20 MED ORDER — CLOPIDOGREL BISULFATE 75 MG PO TABS: 75.0000 mg | ORAL_TABLET | Freq: Every day | ORAL | 2 refills | Status: DC

## 2019-04-20 MED ORDER — SIMVASTATIN 20 MG PO TABS: 20.0000 mg | ORAL_TABLET | Freq: Every day | ORAL | 2 refills | Status: DC

## 2019-04-20 MED ORDER — FUROSEMIDE 20 MG PO TABS: ORAL_TABLET | ORAL | 2 refills | Status: DC

## 2019-04-20 MED ORDER — CARVEDILOL 6.25 MG PO TABS: 6.2500 mg | ORAL_TABLET | Freq: Two times a day (BID) | ORAL | 2 refills | Status: DC

## 2019-04-20 NOTE — Telephone Encounter (Signed)
clopidogrel (PLAVIX) 75 MG tablet take daily is no longer on patients medication list.  Pt is asking for a refill.  Can you advise if the pt is to continue and is it ok to send in for a refill?

## 2019-04-20 NOTE — Telephone Encounter (Signed)
He has been on Plavix in the past seen on my last office note We can refill for 1 or 2 months, I can discuss with him whether to stay on the medication on his next clinic visit He may do fine on low-dose aspirin alone without Plavix given no recent stenting There is an increased bleeding risk with the Plavix that we need to discuss

## 2019-04-20 NOTE — Telephone Encounter (Signed)
*  STAT* If patient is at the pharmacy, call can be transferred to refill team.   1. Which medications need to be refilled? (please list name of each medication and dose if known) Clopidogrel 75 MG   Carvedilol 6.25 MG Furosemide 20 MG Simvastatin 20 MG  2. Which pharmacy/location (including street and city if local pharmacy) is medication to be sent to? [facility] [phone]  3. Do they need a 30 day or 90 day supply? 90 day

## 2019-04-21 NOTE — Telephone Encounter (Signed)
Spoke with patients son regarding recommendations for follow up with provider and that refill sent in 2 months. Advised that I would have scheduling reach out to schedule appointment. He verbalized understanding with no further questions.

## 2019-04-22 NOTE — Telephone Encounter (Signed)
LVM to schedule w/ son

## 2019-06-05 NOTE — Progress Notes (Signed)
Cardiology Office Note  Date:  06/06/2019   ID:  Samuel Mcbride, DOB 12-29-1925, MRN [id number]  PCP:  Abram Sander, MD   Chief Complaint  Patient presents with  . Other    12 month follow up. Meds reviewed verbally with patient.     HPI:  Samuel Mcbride is a 86 y.o. male with PMH of CAD , cath 2014, occluded large D2 at the ostium, CHF CRI, stage III  CR 1.5 Hyperlipidemia ejection fraction of 30-35% by echo 07/06/2014 Ejection fraction 45 to 50% January 2020 Who presents for follow up of his CAD, respiratory distress  On his last clinic visit February 2020 he had bronchitis, antibiotics were started, felt better on Levaquin  Had covid 02/2019 Sinus issue, weak, Back to normal now.  Waiting 90 days before vaccine  Breathing  Ok, No walker  Echocardiogram was performed to rule out cardiac etiology with ejection fraction 45 to 50% up from 30 to 35% on prior study  Lab work reviewed from December 2020 creatinine 1.6 Was previously 2.0 Continues to take Lasix 5 days a week not on the weekends Son who presents with him today reports he has low fluid intake  Blood pressure today around 100 systolic He denies any orthostasis  EKG personally reviewed by myself on todays visit Shows sinus rhythm rate 76 bpm right bundle branch block left axis deviation  other past medical history reviewed Echocardiogram January 2020 Left ventricle: The cavity size was normal. Systolic function was   mildly reduced. The estimated ejection fraction was in the range   of 45% to 50%. Mild diffuse hypokinesis, unable to exclude   lateral wall hypokinesis Doppler parameters are consistent with   abnormal left ventricular relaxation (grade 1 diastolic   dysfunction). - Aorta: Aortic root is mildly dilated, dimension: 40 mm (ED).  Cardiac catheterization 02/25/2013: Severe 1 vessel CAD with occluded large D2 at the ostium, likely culprit for MI. Faint left to left  collaterals.    PMH:   has a past medical history of CHF (congestive heart failure) (HCC), Chronic kidney disease, stage III (moderate), Hyperlipidemia, Hypertension, Inguinal hernia, left (2019), Kidney disease, chronic, stage III (GFR 30-59 ml/min), MI (myocardial infarction) (HCC) (2015), and Pneumonia.  PSH:    Past Surgical History:  Procedure Laterality Date  . CARDIAC CATHETERIZATION  12/14   [facility]  . CHOLECYSTECTOMY    . INGUINAL HERNIA REPAIR Left 12/30/2017   Procedure: HERNIA REPAIR INGUINAL ADULT;  Surgeon: Earline Mayotte, MD;  Location: [facility] ORS;  Service: General;  Laterality: Left;  . PROSTATE ABLATION      Current Outpatient Medications  Medication Sig Dispense Refill  . acetaminophen (TYLENOL) 500 MG tablet Take 500 mg by mouth every 4 (four) hours as needed for moderate pain.     Marland Kitchen albuterol (PROVENTIL HFA;VENTOLIN HFA) 108 (90 Base) MCG/ACT inhaler Inhale 1-2 puffs into the lungs every 6 (six) hours as needed for wheezing or shortness of breath. 1 Inhaler 1  . aspirin 81 MG tablet Take 1 tablet (81 mg total) by mouth daily. 30 tablet 3  . benzonatate (TESSALON PERLES) 100 MG capsule Take 1 capsule (100 mg total) by mouth 3 (three) times daily as needed for cough (Take 1-2 per dose). 30 capsule 0  . budesonide-formoterol (SYMBICORT) 160-4.5 MCG/ACT inhaler Inhale 2 puffs into the lungs 2 (two) times daily.    . carvedilol (COREG) 6.25 MG tablet Take 1 tablet (6.25 mg total) by mouth 2 (two) times  daily with a meal. TAKE 1 TABLET twice a day 180 tablet 2  . cetirizine (ZYRTEC) 10 MG tablet Take 10 mg by mouth daily.    . Cholecalciferol 1000 units tablet Take 1,000 Units by mouth daily.     . clopidogrel (PLAVIX) 75 MG tablet Take 1 tablet (75 mg total) by mouth daily. 30 tablet 2  . furosemide (LASIX) 20 MG tablet Take 1 tablet (20 mg) by mouth once daily Monday-Friday (hold Saturday/ Sunday). 90 tablet 2  . montelukast (SINGULAIR) 10 MG tablet Take 10 mg by mouth  at bedtime.    . Multiple Vitamin (MULTI-VITAMIN) tablet Take by mouth.    . nitroGLYCERIN (NITROSTAT) 0.4 MG SL tablet Place 1 tablet (0.4 mg total) under the tongue every 5 (five) minutes as needed for chest pain. 25 tablet 0  . polyethylene glycol (MIRALAX / GLYCOLAX) 17 g packet Take 17 g by mouth daily. Mix one tablespoon with 8oz of your favorite juice or water every day until you are having soft formed stools. Then start taking once daily if you didn't have a stool the day before. 30 each 1  . simvastatin (ZOCOR) 20 MG tablet Take 1 tablet (20 mg total) by mouth daily. 90 tablet 2   No current facility-administered medications for this visit.     Allergies:   Codeine   Social History:  The patient  reports that he has quit smoking. His smoking use included cigarettes. He has quit using smokeless tobacco.  His smokeless tobacco use included chew. He reports that he does not drink alcohol or use drugs.   Family History:   family history includes Heart disease in his father.    Review of Systems: Review of Systems  Constitutional: Negative.   HENT: Negative.   Respiratory: Negative.   Cardiovascular: Negative.   Gastrointestinal: Negative.   Musculoskeletal: Negative.        Mild gait instability  Neurological: Negative.   Psychiatric/Behavioral: Negative.   All other systems reviewed and are negative.   PHYSICAL EXAM: VS:  BP 102/62 (BP Location: Left Arm, Patient Position: Sitting, Cuff Size: Normal)   Pulse 76   Ht 6' (1.829 m)   Wt 178 lb (80.7 kg)   SpO2 98%   BMI 24.14 kg/m  , BMI Body mass index is 24.14 kg/m. Constitutional:  oriented to person, place, and time. No distress.  HENT:  Head: Grossly normal Eyes:  no discharge. No scleral icterus.  Neck: No JVD, no carotid bruits  Cardiovascular: Regular rate and rhythm, no murmurs appreciated Pulmonary/Chest: Clear to auscultation bilaterally, no wheezes Scattered Rales left base Abdominal: Soft.  no  distension.  no tenderness.  Musculoskeletal: Normal range of motion Neurological:  normal muscle tone. Coordination normal. No atrophy Skin: Skin warm and dry Psychiatric: normal affect, pleasant    Recent Labs: 03/21/2019: ALT 26; BUN 32; Creatinine, Ser 1.62; Hemoglobin 15.2; Platelets 335; Potassium 4.3; Sodium 138    Lipid Panel Lab Results  Component Value Date   CHOL 113 06/15/2015   HDL 42 06/15/2015   LDLCALC 50 06/15/2015   TRIG 103 06/15/2015      Wt Readings from Last 3 Encounters:  06/06/19 178 lb (80.7 kg)  04/07/19 173 lb 6.4 oz (78.7 kg)  03/28/19 173 lb (78.5 kg)     ASSESSMENT AND PLAN:  Coronary artery disease of native artery of native heart with stable angina pectoris ([place]) - Plan: EKG 12-Lead Currently with no symptoms of angina. No further workup  at this time. Continue current medication regimen.  Essential hypertension Blood pressure low  Losartan already on hold Recommend he decrease Lasix down to 3 days a week down from 5 days a week Son reports he has low fluid intake  Other hyperlipidemia Numbers at goal, no changes made  Chronic systolic congestive heart failure (HCC) - Plan: EKG 12-Lead Ejection fraction improved to 45 to 50% on echo Lasix 3 days a week, continue Coreg  Chronic renal insufficiency, stage III Creatinine previously greater than 2 now down to 1.6 Will decrease Lasix down to 3 days a week given low blood pressure   Total encounter time more than 25 minutes  Greater than 50% was spent in counseling and coordination of care with the patient   Disposition:   F/U  6 month   Orders Placed This Encounter  Procedures  . EKG 12-Lead     Signed, Dossie Arbour, M.D., Ph.D. 06/06/2019  [facility] [place], [place] [phone]

## 2019-06-06 ENCOUNTER — Ambulatory Visit (INDEPENDENT_AMBULATORY_CARE_PROVIDER_SITE_OTHER): Payer: Medicare Other | Admitting: Cardiovascular Disease

## 2019-06-06 ENCOUNTER — Encounter: Payer: Self-pay | Admitting: Cardiovascular Disease

## 2019-06-06 ENCOUNTER — Other Ambulatory Visit: Payer: Self-pay

## 2019-06-06 VITALS — BP 102/62 | HR 76 | Ht 72.0 in | Wt 178.0 lb

## 2019-06-06 DIAGNOSIS — N183 Chronic kidney disease, stage 3 unspecified: Secondary | ICD-10-CM

## 2019-06-06 DIAGNOSIS — I10 Essential (primary) hypertension: Secondary | ICD-10-CM

## 2019-06-06 DIAGNOSIS — E7849 Other hyperlipidemia: Secondary | ICD-10-CM

## 2019-06-06 DIAGNOSIS — I25118 Atherosclerotic heart disease of native coronary artery with other forms of angina pectoris: Secondary | ICD-10-CM

## 2019-06-06 DIAGNOSIS — I5022 Chronic systolic (congestive) heart failure: Secondary | ICD-10-CM

## 2019-06-06 DIAGNOSIS — R0602 Shortness of breath: Secondary | ICD-10-CM

## 2019-06-06 NOTE — Patient Instructions (Signed)
Medication Instructions:  Please decrease the lasix down to 3 days a week, Mon/Wed/Fri, none on weekends  If you need a refill on your cardiac medications before your next appointment, please call your pharmacy.    Lab work: No new labs needed   If you have labs (blood work) drawn today and your tests are completely normal, you will receive your results only by: Marland Kitchen MyChart Message (if you have MyChart) OR . A paper copy in the mail If you have any lab test that is abnormal or we need to change your treatment, we will call you to review the results.   Testing/Procedures: No new testing needed   Follow-Up: At [facility], you and your health needs are our priority.  As part of our continuing mission to provide you with exceptional heart care, we have created designated Provider Care Teams.  These Care Teams include your primary Cardiologist (physician) and Advanced Practice Providers (APPs -  Physician Assistants and Nurse Practitioners) who all work together to provide you with the care you need, when you need it.  . You will need a follow up appointment in 6 months   . Providers on your designated Care Team:   . Nicolasa Ducking, NP . Eula Listen, PA-C . Marisue Ivan, PA-C  Any Other Special Instructions Will Be Listed Below (If Applicable).  For educational health videos Log in to : [email or web address] Or : [email or web address], password : [email or web address]

## 2019-12-05 ENCOUNTER — Telehealth: Payer: Self-pay | Admitting: Cardiovascular Disease

## 2019-12-05 NOTE — Telephone Encounter (Signed)
  Patient Consent for Virtual Visit         Samuel Mcbride has provided verbal consent on 12/05/2019 for a virtual visit (video or telephone).   CONSENT FOR VIRTUAL VISIT FOR:  Samuel Mcbride  By participating in this virtual visit I agree to the following:  I hereby voluntarily request, consent and authorize [facility] and its employed or contracted physicians, physician assistants, nurse practitioners or other licensed health care professionals (the Practitioner), to provide me with telemedicine health care services (the "Services") as deemed necessary by the treating Practitioner. I acknowledge and consent to receive the Services by the Practitioner via telemedicine. I understand that the telemedicine visit will involve communicating with the Practitioner through live audiovisual communication technology and the disclosure of certain medical information by electronic transmission. I acknowledge that I have been given the opportunity to request an in-person assessment or other available alternative prior to the telemedicine visit and am voluntarily participating in the telemedicine visit.  I understand that I have the right to withhold or withdraw my consent to the use of telemedicine in the course of my care at any time, without affecting my right to future care or treatment, and that the Practitioner or I may terminate the telemedicine visit at any time. I understand that I have the right to inspect all information obtained and/or recorded in the course of the telemedicine visit and may receive copies of available information for a reasonable fee.  I understand that some of the potential risks of receiving the Services via telemedicine include:  Marland Kitchen Delay or interruption in medical evaluation due to technological equipment failure or disruption; . Information transmitted may not be sufficient (e.g. poor resolution of images) to allow for appropriate medical decision making by the  Practitioner; and/or  . In rare instances, security protocols could fail, causing a breach of personal health information.  Furthermore, I acknowledge that it is my responsibility to provide information about my medical history, conditions and care that is complete and accurate to the best of my ability. I acknowledge that Practitioner's advice, recommendations, and/or decision may be based on factors not within their control, such as incomplete or inaccurate data provided by me or distortions of diagnostic images or specimens that may result from electronic transmissions. I understand that the practice of medicine is not an exact science and that Practitioner makes no warranties or guarantees regarding treatment outcomes. I acknowledge that a copy of this consent can be made available to me via my patient portal [facility] MyChart), or I can request a printed copy by calling the office of [facility].    I understand that my insurance will be billed for this visit.   I have read or had this consent read to me. . I understand the contents of this consent, which adequately explains the benefits and risks of the Services being provided via telemedicine.  . I have been provided ample opportunity to ask questions regarding this consent and the Services and have had my questions answered to my satisfaction. . I give my informed consent for the services to be provided through the use of telemedicine in my medical care

## 2019-12-07 ENCOUNTER — Ambulatory Visit: Payer: Medicare Other | Admitting: Cardiovascular Disease

## 2019-12-13 NOTE — Progress Notes (Signed)
Virtual Visit via Video Note   This visit type was conducted due to national recommendations for restrictions regarding the COVID-19 Pandemic (e.g. social distancing) in an effort to limit this patient's exposure and mitigate transmission in our community.  Due to his co-morbid illnesses, this patient is at least at moderate risk for complications without adequate follow up.  This format is felt to be most appropriate for this patient at this time.  All issues noted in this document were discussed and addressed.  A limited physical exam was performed with this format.  Please refer to the patient's chart for his consent to telehealth for [facility].  Video call, was not transitioned to audio at any point during the call  I connected with  Volney American on 12/14/19 by a video enabled telemedicine application and verified that I am speaking with the correct person using two identifiers. I am contacting the patient above from our cardiology clinic office or alternate office work station to their home, I discussed the limitations of evaluation and management by telemedicine. The patient expressed understanding and agreed to proceed.   Evaluation Performed:  Follow-up visit  Date:  12/14/2019   ID:  Samuel Mcbride, Samuel Mcbride [birth year], MRN [id number]  Patient Location:  [address] [facility] [place] [address]   Provider location:   [facility], [place] office  PCP:  Abram Sander, MD  Cardiologist:  Hubbard Robinson [facility]   Chief Complaint  Patient presents with  . other    6 month f/u no complaints today. Meds reviewed verbally with pt.     History of Present Illness:    Samuel Mcbride is a 86 y.o. male who presents via audio/video conferencing for a telehealth visit today.   The patient does not symptoms concerning for COVID-19 infection (fever, chills, cough, or new SHORTNESS OF BREATH).   Patient has a past medical history of CAD , cath 2014, occluded large  D2 at the ostium, CHF CRI, stage III  CR 1.5 Hyperlipidemia ejection fraction of 30-35% by echo 07/06/2014 Ejection fraction 45 to 50% January 2020 Who presents for follow up of his CAD, respiratory distress  Feels well, overall no complaints Lasix 20 mg three times a week Denies any leg swelling abdominal bloating weight gain or shortness of breath  Very sedentary at baseline, even drives to the mailbox, walks a little bit No recent falls Son who is on the call with him today reports his mobility is " good" Watches TV, no other hobbies  Denies any orthostasis symptoms  Lab work reviewed CR 1.65, down 3 [facility] nephrology Baseline CR 1.75 in 2019  No recent visits to the hospital in 2021   February 2020 he had bronchitis, antibiotics were started, felt better on Levaquin Had covid 02/2019  Echocardiogram early 2020 was performed to rule out cardiac etiology with ejection fraction 45 to 50% up from 30 to 35% on prior study  Lab work reviewed from December 2020 creatinine 1.6 Was previously 2.0 Continues to take Lasix 5 days a week not on the weekends Son who presents with him today reports he has low fluid intake  other past medical history reviewed Echocardiogram January 2020 Left ventricle: The cavity size was normal. Systolic function was   mildly reduced. The estimated ejection fraction was in the range   of 45% to 50%. Mild diffuse hypokinesis, unable to exclude   lateral wall hypokinesis Doppler parameters are consistent with  abnormal left ventricular relaxation (grade 1 diastolic   dysfunction). - Aorta: Aortic root is mildly dilated, dimension: 40 mm (ED).  Cardiac catheterization 02/25/2013: Severe 1 vessel CAD with occluded large D2 at the ostium, likely culprit for MI. Faint left to left collaterals.     Prior CV studies:   The following studies were reviewed today:    Past Medical History:  Diagnosis Date  . CHF (congestive heart failure) (HCC)     . Chronic kidney disease, stage III (moderate)   . Hyperlipidemia   . Hypertension   . Inguinal hernia, left 2019  . Kidney disease, chronic, stage III (GFR 30-59 ml/min)   . MI (myocardial infarction) (HCC) 2015  . Pneumonia    Past Surgical History:  Procedure Laterality Date  . CARDIAC CATHETERIZATION  12/14   [facility]  . CHOLECYSTECTOMY    . INGUINAL HERNIA REPAIR Left 12/30/2017   Procedure: HERNIA REPAIR INGUINAL ADULT;  Surgeon: Earline Mayotte, MD;  Location: [facility] ORS;  Service: General;  Laterality: Left;  . PROSTATE ABLATION        Allergies:   Codeine   Social History   Tobacco Use  . Smoking status: Former Smoker    Types: Cigarettes  . Smokeless tobacco: Former Neurosurgeon    Types: Engineer, drilling  . Vaping Use: Never used  Substance Use Topics  . Alcohol use: No  . Drug use: No     Current Outpatient Medications on File Prior to Visit  Medication Sig Dispense Refill  . acetaminophen (TYLENOL) 500 MG tablet Take 500 mg by mouth every 4 (four) hours as needed for moderate pain.     Marland Kitchen albuterol (PROVENTIL HFA;VENTOLIN HFA) 108 (90 Base) MCG/ACT inhaler Inhale 1-2 puffs into the lungs every 6 (six) hours as needed for wheezing or shortness of breath. 1 Inhaler 1  . aspirin 81 MG tablet Take 1 tablet (81 mg total) by mouth daily. 30 tablet 3  . benzonatate (TESSALON PERLES) 100 MG capsule Take 1 capsule (100 mg total) by mouth 3 (three) times daily as needed for cough (Take 1-2 per dose). 30 capsule 0  . budesonide-formoterol (SYMBICORT) 160-4.5 MCG/ACT inhaler Inhale 2 puffs into the lungs 2 (two) times daily.    . carvedilol (COREG) 6.25 MG tablet Take 1 tablet (6.25 mg total) by mouth 2 (two) times daily with a meal. TAKE 1 TABLET twice a day 180 tablet 2  . cetirizine (ZYRTEC) 10 MG tablet Take 10 mg by mouth daily.    . Cholecalciferol 1000 units tablet Take 1,000 Units by mouth daily.     . clopidogrel (PLAVIX) 75 MG tablet Take 1 tablet (75 mg total) by mouth  daily. 30 tablet 2  . furosemide (LASIX) 20 MG tablet Take 1 tablet (20 mg) by mouth once daily Monday-Friday (hold Saturday/ Sunday). 90 tablet 2  . montelukast (SINGULAIR) 10 MG tablet Take 10 mg by mouth at bedtime.    . Multiple Vitamin (MULTI-VITAMIN) tablet Take by mouth.    . nitroGLYCERIN (NITROSTAT) 0.4 MG SL tablet Place 1 tablet (0.4 mg total) under the tongue every 5 (five) minutes as needed for chest pain. 25 tablet 0  . polyethylene glycol (MIRALAX / GLYCOLAX) 17 g packet Take 17 g by mouth daily. Mix one tablespoon with 8oz of your favorite juice or water every day until you are having soft formed stools. Then start taking once daily if you didn't have a stool the day before. 30 each 1  .  simvastatin (ZOCOR) 20 MG tablet Take 1 tablet (20 mg total) by mouth daily. 90 tablet 2   No current facility-administered medications on file prior to visit.     Family Hx: The patient's family history includes Heart disease in his father.  ROS:   Please see the history of present illness.    Review of Systems  Constitutional: Negative.   HENT: Negative.   Respiratory: Negative.   Cardiovascular: Negative.   Gastrointestinal: Negative.   Musculoskeletal: Negative.        Gait instability  Neurological: Negative.   Psychiatric/Behavioral: Negative.   All other systems reviewed and are negative.    Labs/Other Tests and Data Reviewed:    Recent Labs: 03/21/2019: ALT 26; BUN 32; Creatinine, Ser 1.62; Hemoglobin 15.2; Platelets 335; Potassium 4.3; Sodium 138   Recent Lipid Panel Lab Results  Component Value Date/Time   CHOL 113 06/15/2015 08:10 AM   CHOL 138 02/25/2013 03:57 AM   TRIG 103 06/15/2015 08:10 AM   TRIG 134 02/25/2013 03:57 AM   HDL 42 06/15/2015 08:10 AM   HDL 40 02/25/2013 03:57 AM   CHOLHDL 2.7 06/15/2015 08:10 AM   LDLCALC 50 06/15/2015 08:10 AM   LDLCALC 71 02/25/2013 03:57 AM    Wt Readings from Last 3 Encounters:  12/14/19 178 lb (80.7 kg)  06/06/19  178 lb (80.7 kg)  04/07/19 173 lb 6.4 oz (78.7 kg)     Exam:    Vital Signs: Vital signs may also be detailed in the HPI BP 135/80 (BP Location: Right Arm, Patient Position: Sitting, Cuff Size: Normal)   Pulse 68   Ht 6' (1.829 m)   Wt 178 lb (80.7 kg)   BMI 24.14 kg/m   Wt Readings from Last 3 Encounters:  12/14/19 178 lb (80.7 kg)  06/06/19 178 lb (80.7 kg)  04/07/19 173 lb 6.4 oz (78.7 kg)   Temp Readings from Last 3 Encounters:  04/07/19 (!) 97.4 F (36.3 C) (Oral)  03/28/19 97.9 F (36.6 C) (Oral)  03/22/19 97.8 F (36.6 C) (Oral)   BP Readings from Last 3 Encounters:  12/14/19 135/80  06/06/19 102/62  04/07/19 96/64   Pulse Readings from Last 3 Encounters:  12/14/19 68  06/06/19 76  04/07/19 99     Well nourished, well developed male in no acute distress. Constitutional:  oriented to person, place, and time. No distress.  Head: Normocephalic and atraumatic.  Eyes:  no discharge. No scleral icterus.  Neck: Normal range of motion. Neck supple.  Psychiatric:  normal mood and affect. behavior is normal. Thought content normal.    ASSESSMENT & PLAN:    Problem List Items Addressed This Visit      Cardiology Problems   Essential hypertension   Chronic systolic congestive heart failure (HCC)   CAD (coronary artery disease) - Primary     Other   CRI (chronic renal insufficiency), stage 3 (moderate) (HCC)    Other Visit Diagnoses    SOB (shortness of breath)       Chronic renal insufficiency, stage II (mild)         Coronary artery disease of native artery of native heart with stable angina pectoris (HCC) -  Currently with no symptoms of angina. No further workup at this time. Continue current medication regimen. Stable  Essential hypertension Stable blood pressure, no orthostasis, no medication changes made  Other hyperlipidemia We will follow up on lab work when he is next seen in the office, previously at goal  Chronic systolic congestive  heart failure (HCC) - Plan: EKG 12-Lead Ejection fraction improved to 45 to 50% on echo Continue Lasix 20 mg 3 times a week, carvedilol, Prior office visit was hypertensive, no changes to his medications  Chronic renal insufficiency, stage III Creatinine previously greater than 2  Most recent lab work 1.6, stable  Followed by nephrology at [facility]    COVID-19 Education: The signs and symptoms of COVID-19 were discussed with the patient and how to seek care for testing (follow up with PCP or arrange E-visit).  The importance of social distancing was discussed today.  Patient Risk:   After full review of this patients clinical status, I feel that they are at least moderate risk at this time.  Time:   Today, I have spent 25 minutes with the patient with telehealth technology discussing the cardiac and medical problems/diagnoses detailed above   Additional 10 min spent reviewing the chart prior to patient visit today   Medication Adjustments/Labs and Tests Ordered: Current medicines are reviewed at length with the patient today.  Concerns regarding medicines are outlined above.   Tests Ordered: No tests ordered   Medication Changes: No changes made     Signed, Julien Nordmann, MD  [facility] [address], [place], [place] [address]

## 2019-12-14 ENCOUNTER — Telehealth (INDEPENDENT_AMBULATORY_CARE_PROVIDER_SITE_OTHER): Payer: Medicare Other | Admitting: Cardiovascular Disease

## 2019-12-14 ENCOUNTER — Other Ambulatory Visit: Payer: Self-pay

## 2019-12-14 ENCOUNTER — Encounter: Payer: Self-pay | Admitting: Cardiovascular Disease

## 2019-12-14 VITALS — BP 135/80 | HR 68 | Ht 72.0 in | Wt 178.0 lb

## 2019-12-14 DIAGNOSIS — R0602 Shortness of breath: Secondary | ICD-10-CM

## 2019-12-14 DIAGNOSIS — I25118 Atherosclerotic heart disease of native coronary artery with other forms of angina pectoris: Secondary | ICD-10-CM | POA: Diagnosis not present

## 2019-12-14 DIAGNOSIS — I10 Essential (primary) hypertension: Secondary | ICD-10-CM

## 2019-12-14 DIAGNOSIS — N183 Chronic kidney disease, stage 3 unspecified: Secondary | ICD-10-CM | POA: Diagnosis not present

## 2019-12-14 DIAGNOSIS — I5022 Chronic systolic (congestive) heart failure: Secondary | ICD-10-CM

## 2019-12-14 DIAGNOSIS — N182 Chronic kidney disease, stage 2 (mild): Secondary | ICD-10-CM

## 2019-12-14 NOTE — Patient Instructions (Signed)
Medication Instructions:  No changes  If you need a refill on your cardiac medications before your next appointment, please call your pharmacy.    Lab work: No new labs needed   If you have labs (blood work) drawn today and your tests are completely normal, you will receive your results only by: . MyChart Message (if you have MyChart) OR . A paper copy in the mail If you have any lab test that is abnormal or we need to change your treatment, we will call you to review the results.   Testing/Procedures: No new testing needed   Follow-Up: At [facility], you and your health needs are our priority.  As part of our continuing mission to provide you with exceptional heart care, we have created designated Provider Care Teams.  These Care Teams include your primary Cardiologist (physician) and Advanced Practice Providers (APPs -  Physician Assistants and Nurse Practitioners) who all work together to provide you with the care you need, when you need it.  . You will need a follow up appointment in 6 months  . Providers on your designated Care Team:   . Christopher Berge, NP . Ryan Dunn, PA-C . Jacquelyn Visser, PA-C  Any Other Special Instructions Will Be Listed Below (If Applicable).  COVID-19 Vaccine Information can be found at: [email or web address] For questions related to vaccine distribution or appointments, please email [email or web address] or call [phone].     

## 2020-01-16 ENCOUNTER — Other Ambulatory Visit: Payer: Self-pay | Admitting: Cardiovascular Disease

## 2020-01-16 MED ORDER — SIMVASTATIN 20 MG PO TABS: 20.0000 mg | ORAL_TABLET | Freq: Every day | ORAL | 2 refills | Status: DC

## 2020-01-16 MED ORDER — FUROSEMIDE 20 MG PO TABS: ORAL_TABLET | ORAL | 2 refills | Status: DC

## 2020-01-16 MED ORDER — CARVEDILOL 6.25 MG PO TABS: 6.2500 mg | ORAL_TABLET | Freq: Two times a day (BID) | ORAL | 2 refills | Status: DC

## 2020-01-16 NOTE — Telephone Encounter (Signed)
°*  STAT* If patient is at the pharmacy, call can be transferred to refill team.   1. Which medications need to be refilled? (please list name of each medication and dose if known) simvastatin 20 mg, carvedilol 6.25 bid, furosemide 20 mg 3x weekly  2. Which pharmacy/location (including street and city if local pharmacy) is medication to be sent to?[facility]  3. Do they need a 30 day or 90 day supply? 90

## 2020-01-16 NOTE — Telephone Encounter (Signed)
Requested Prescriptions   Signed Prescriptions Disp Refills  . carvedilol (COREG) 6.25 MG tablet 180 tablet 2    Sig: Take 1 tablet (6.25 mg total) by mouth 2 (two) times daily with a meal. TAKE 1 TABLET twice a day    Authorizing Provider: Antonieta Iba    Ordering User: Shawnie Dapper, Addalynne Golding C  . simvastatin (ZOCOR) 20 MG tablet 90 tablet 2    Sig: Take 1 tablet (20 mg total) by mouth daily.    Authorizing Provider: Antonieta Iba    Ordering User: Shawnie Dapper, Ourania Hamler C  . furosemide (LASIX) 20 MG tablet 90 tablet 2    Sig: Take 1 tablet (20 mg) by mouth once daily Monday-Friday (hold Saturday/ Sunday).    Authorizing Provider: Antonieta Iba    Ordering User: Kendrick Fries

## 2020-03-09 ENCOUNTER — Other Ambulatory Visit: Payer: Self-pay | Admitting: Cardiovascular Disease

## 2020-10-16 ENCOUNTER — Other Ambulatory Visit: Payer: Self-pay | Admitting: Cardiovascular Disease

## 2020-10-16 NOTE — Telephone Encounter (Signed)
Please schedule overdue 6 month F/U appointment. Thank you! °

## 2020-10-16 NOTE — Telephone Encounter (Signed)
Scheduled

## 2020-10-24 ENCOUNTER — Encounter: Payer: Self-pay | Admitting: Cardiovascular Disease

## 2020-10-24 ENCOUNTER — Ambulatory Visit (INDEPENDENT_AMBULATORY_CARE_PROVIDER_SITE_OTHER): Payer: Medicare Other | Admitting: Cardiovascular Disease

## 2020-10-24 ENCOUNTER — Other Ambulatory Visit: Payer: Self-pay

## 2020-10-24 ENCOUNTER — Telehealth: Payer: Self-pay | Admitting: Cardiovascular Disease

## 2020-10-24 VITALS — BP 100/62 | HR 59 | Ht 73.0 in | Wt 181.4 lb

## 2020-10-24 DIAGNOSIS — I5022 Chronic systolic (congestive) heart failure: Secondary | ICD-10-CM | POA: Diagnosis not present

## 2020-10-24 DIAGNOSIS — I10 Essential (primary) hypertension: Secondary | ICD-10-CM | POA: Diagnosis not present

## 2020-10-24 DIAGNOSIS — I25118 Atherosclerotic heart disease of native coronary artery with other forms of angina pectoris: Secondary | ICD-10-CM | POA: Diagnosis not present

## 2020-10-24 DIAGNOSIS — Z79899 Other long term (current) drug therapy: Secondary | ICD-10-CM

## 2020-10-24 DIAGNOSIS — N183 Chronic kidney disease, stage 3 unspecified: Secondary | ICD-10-CM

## 2020-10-24 DIAGNOSIS — E7849 Other hyperlipidemia: Secondary | ICD-10-CM

## 2020-10-24 MED ORDER — FUROSEMIDE 20 MG PO TABS: ORAL_TABLET | ORAL | 3 refills | Status: DC

## 2020-10-24 MED ORDER — SIMVASTATIN 20 MG PO TABS: 20.0000 mg | ORAL_TABLET | Freq: Every day | ORAL | 3 refills | Status: DC

## 2020-10-24 MED ORDER — CARVEDILOL 6.25 MG PO TABS: ORAL_TABLET | ORAL | 3 refills | Status: DC

## 2020-10-24 MED ORDER — NITROGLYCERIN 0.4 MG SL SUBL: 0.4000 mg | SUBLINGUAL_TABLET | SUBLINGUAL | 3 refills | Status: AC | PRN

## 2020-10-24 MED ORDER — CLOPIDOGREL BISULFATE 75 MG PO TABS: 75.0000 mg | ORAL_TABLET | Freq: Every day | ORAL | 0 refills | Status: AC

## 2020-10-24 NOTE — Telephone Encounter (Signed)
Was able to call pt's son back, [name] (DPR approved). [name] was not able to attend visit today, pt came with daughter.   Bob following-up w/OV, advised on no med changes, BP 100/64, advised if pt continues to be dizzy, BP low, then we may need to cut back on one or more of his BP medications, otherwise no changes at this time.  [name] verbalized understanding and thankful for the return call.

## 2020-10-24 NOTE — Telephone Encounter (Signed)
Please call son to discuss medication changes that were made today at patients visit.

## 2020-10-24 NOTE — Progress Notes (Signed)
Date:  10/24/2020   ID:  ATLAS CROSSLAND, DOB 12/31/1925, MRN [id number]  Patient Location:  [address] [place] [address]   Provider location:   [facility], [place] office  PCP:  Abram Sander, MD  Cardiologist:  Fonnie Mu   Chief Complaint  Patient presents with   12 month follow up    "Doing well." Medications reviewed by the patient's home medication list.     History of Present Illness:    Samuel Mcbride is a 87 y.o. male past medical history of CAD , cath 2014, occluded large D2 at the ostium, CHF CRI, stage III  CR 1.5 Hyperlipidemia ejection fraction of 30-35% by echo 07/06/2014 Ejection fraction 45 to 50% January 2020 Who presents for follow up of his CAD, respiratory distress  Last seen in clinic by video visit September 2021 At that time was taking Lasix 23 times a week Denied abdominal swelling leg edema or shortness of breath  Presents today with daughter-in-law Reports he is doing well  Sedentary lifestyle, Driving to the mailbox, no falls, Watches TV, does some outdoor activities Son and other family help take care of him  Denies any orthostasis symptoms Initial blood pressure running low 100s systolic, up to 110 on my recheck  Takes Lasix sparingly Denies chest pain or shortness of breath  Lab work reviewed CR 1.65 [facility] nephrology Baseline CR 1.75 in 2019  No recent hospital admissions  EKG personally reviewed by myself on todays visit Normal sinus rhythm rate 59 bpm left bundle branch block, no change from prior EKG  Echocardiogram early 2020 was performed to rule out cardiac etiology with ejection fraction 45 to 50% up from 30 to 35% on prior study   Cardiac catheterization 02/25/2013: Severe 1 vessel CAD with occluded large D2 at the ostium, likely culprit for MI. Faint left to left collaterals.   Past Medical History:  Diagnosis Date   CHF (congestive heart failure) (HCC)    Chronic  kidney disease, stage III (moderate) (HCC)    Hyperlipidemia    Hypertension    Inguinal hernia, left 2019   Kidney disease, chronic, stage III (GFR 30-59 ml/min) (HCC)    MI (myocardial infarction) (HCC) 2015   Pneumonia    Past Surgical History:  Procedure Laterality Date   CARDIAC CATHETERIZATION  12/14   [facility]   CHOLECYSTECTOMY     INGUINAL HERNIA REPAIR Left 12/30/2017   Procedure: HERNIA REPAIR INGUINAL ADULT;  Surgeon: Earline Mayotte, MD;  Location: [facility] ORS;  Service: General;  Laterality: Left;   PROSTATE ABLATION        Allergies:   Codeine   Social History   Tobacco Use   Smoking status: Former    Types: Cigarettes   Smokeless tobacco: Former    Types: Associate Professor Use: Never used  Substance Use Topics   Alcohol use: No   Drug use: No     Current Outpatient Medications on File Prior to Visit  Medication Sig Dispense Refill   acetaminophen (TYLENOL) 500 MG tablet Take 500 mg by mouth every 4 (four) hours as needed for moderate pain.      benzonatate (TESSALON PERLES) 100 MG capsule Take 1 capsule (100 mg total) by mouth 3 (three) times daily as needed for cough (Take 1-2 per dose). 30 capsule 0   budesonide-formoterol (SYMBICORT) 160-4.5 MCG/ACT inhaler Inhale 2 puffs into the lungs 2 (  two) times daily.     carvedilol (COREG) 6.25 MG tablet TAKE 1 TABLET BY MOUTH 2 TIMES A DAY WITH A MEAL 180 tablet 0   Cholecalciferol 1000 units tablet Take 1,000 Units by mouth daily.      clopidogrel (PLAVIX) 75 MG tablet Take 1 tablet (75 mg total) by mouth daily. 90 tablet 0   fexofenadine (ALLEGRA) 180 MG tablet Take 180 mg by mouth daily.     furosemide (LASIX) 20 MG tablet Take 1 tablet (20 mg) by mouth once daily Monday-Friday (hold Saturday/ Sunday). 90 tablet 2   montelukast (SINGULAIR) 10 MG tablet Take 10 mg by mouth at bedtime.     Multiple Vitamin (MULTI-VITAMIN) tablet Take by mouth.     nitroGLYCERIN (NITROSTAT) 0.4 MG SL tablet Place 1 tablet  (0.4 mg total) under the tongue every 5 (five) minutes as needed for chest pain. 25 tablet 0   polyethylene glycol (MIRALAX / GLYCOLAX) 17 g packet Take 17 g by mouth daily. Mix one tablespoon with 8oz of your favorite juice or water every day until you are having soft formed stools. Then start taking once daily if you didn't have a stool the day before. 30 each 1   simvastatin (ZOCOR) 20 MG tablet Take 1 tablet (20 mg total) by mouth daily. 90 tablet 2   albuterol (PROVENTIL HFA;VENTOLIN HFA) 108 (90 Base) MCG/ACT inhaler Inhale 1-2 puffs into the lungs every 6 (six) hours as needed for wheezing or shortness of breath. (Patient not taking: Reported on 10/24/2020) 1 Inhaler 1   aspirin 81 MG tablet Take 1 tablet (81 mg total) by mouth daily. (Patient not taking: Reported on 10/24/2020) 30 tablet 3   cetirizine (ZYRTEC) 10 MG tablet Take 10 mg by mouth daily. (Patient not taking: Reported on 10/24/2020)     No current facility-administered medications on file prior to visit.     Family Hx: The patient's family history includes Heart disease in his father.  ROS:   Please see the history of present illness.    Review of Systems  Constitutional: Negative.   HENT: Negative.    Respiratory: Negative.    Cardiovascular: Negative.   Gastrointestinal: Negative.   Musculoskeletal: Negative.        Gait instability  Neurological: Negative.   Psychiatric/Behavioral: Negative.    All other systems reviewed and are negative.   Labs/Other Tests and Data Reviewed:    Recent Labs: No results found for requested labs within last 8760 hours.   Recent Lipid Panel Lab Results  Component Value Date/Time   CHOL 113 06/15/2015 08:10 AM   CHOL 138 02/25/2013 03:57 AM   TRIG 103 06/15/2015 08:10 AM   TRIG 134 02/25/2013 03:57 AM   HDL 42 06/15/2015 08:10 AM   HDL 40 02/25/2013 03:57 AM   CHOLHDL 2.7 06/15/2015 08:10 AM   LDLCALC 50 06/15/2015 08:10 AM   LDLCALC 71 02/25/2013 03:57 AM    Wt Readings  from Last 3 Encounters:  10/24/20 181 lb 6 oz (82.3 kg)  12/14/19 178 lb (80.7 kg)  06/06/19 178 lb (80.7 kg)     Exam:    Vital Signs: Vital signs may also be detailed in the HPI BP 100/62 (BP Location: Left Arm, Patient Position: Sitting, Cuff Size: Normal)   Pulse (!) 59   Ht 6\' 1"  (1.854 m)   Wt 181 lb 6 oz (82.3 kg)   SpO2 96%   BMI 23.93 kg/m  Constitutional:  oriented to person, place,  and time. No distress.  HENT:  Head: Grossly normal Eyes:  no discharge. No scleral icterus.  Neck: No JVD, no carotid bruits  Cardiovascular: Regular rate and rhythm, no murmurs appreciated Pulmonary/Chest: Clear to auscultation bilaterally, no wheezes or rails Abdominal: Soft.  no distension.  no tenderness.  Musculoskeletal: Normal range of motion Neurological:  normal muscle tone. Coordination normal. No atrophy Skin: Skin warm and dry Psychiatric: normal affect, pleasant   ASSESSMENT & PLAN:    Problem List Items Addressed This Visit       Cardiology Problems   Essential hypertension   Chronic systolic congestive heart failure (HCC)   Relevant Orders   EKG 12-Lead   CAD (coronary artery disease) - Primary   Relevant Orders   EKG 12-Lead   Hyperlipidemia     Other   CRI (chronic renal insufficiency), stage 3 (moderate) (HCC)  Coronary artery disease of native artery of native heart with stable angina pectoris (HCC) -  Currently with no symptoms of angina. No further workup at this time. Continue current medication regimen. Stable   Essential hypertension Stable blood pressure, no orthostasis, no medication changes made   Other hyperlipidemia We will follow up on lab work when he is next seen in the office, previously at goal   Chronic systolic congestive heart failure (HCC) - Plan: EKG 12-Lead Ejection fraction 45 to 50% on echo Appears euvolemic Continue Lasix 20 mg 3 times a week, carvedilol at current dose but may need to decrease the carvedilol for any  orthostasis symptoms   Chronic renal insufficiency, stage III Creatinine previously greater than 2  Most recent lab work 1.6,  Followed by nephrology at [facility] stable   Total encounter time more than 25 minutes  Greater than 50% was spent in counseling and coordination of care with the patient    Signed, Julien Nordmann, MD  [facility] [address], [place], [place] [address]

## 2020-10-24 NOTE — Patient Instructions (Addendum)
Call the office if you get dizzy  Medication Instructions:  No changes  If you need a refill on your cardiac medications before your next appointment, please call your pharmacy.   Lab work: Lipids & LFT (hepatic function) Results will be on MyChart, will call only for ABNORMAL results  Testing/Procedures: No new testing needed  Follow-Up: At [facility], you and your health needs are our priority.  As part of our continuing mission to provide you with exceptional heart care, we have created designated Provider Care Teams.  These Care Teams include your primary Cardiologist (physician) and Advanced Practice Providers (APPs -  Physician Assistants and Nurse Practitioners) who all work together to provide you with the care you need, when you need it.  You will need a follow up appointment in 12 months  Providers on your designated Care Team:   Nicolasa Ducking, NP Eula Listen, PA-C Marisue Ivan, PA-C Cadence [place], [place]  COVID-19 Vaccine Information can be found at: [email or web address] For questions related to vaccine distribution or appointments, please email [email or web address] or call [phone].

## 2020-10-25 LAB — LIPID PANEL
Chol/HDL Ratio: 2.5 ratio (ref 0.0–5.0)
Cholesterol, Total: 107 mg/dL (ref 100–199)
HDL: 42 mg/dL (ref >39)
LDL Chol Calc (NIH): 44 mg/dL (ref 0–99)
Triglycerides: 114 mg/dL (ref 0–149)
VLDL Cholesterol Cal: 21 mg/dL (ref 5–40)

## 2020-10-25 LAB — HEPATIC FUNCTION PANEL
ALT: 12 IU/L (ref 0–44)
AST: 14 IU/L (ref 0–40)
Albumin: 3.9 g/dL (ref 3.5–4.6)
Alkaline Phosphatase: 99 IU/L (ref 44–121)
Bilirubin Total: 0.6 mg/dL (ref 0.0–1.2)
Bilirubin, Direct: 0.17 mg/dL (ref 0.00–0.40)
Total Protein: 6.2 g/dL (ref 6.0–8.5)

## 2020-11-05 ENCOUNTER — Other Ambulatory Visit: Payer: Self-pay | Admitting: Cardiovascular Disease

## 2021-01-14 ENCOUNTER — Other Ambulatory Visit: Payer: Self-pay | Admitting: Cardiovascular Disease

## 2021-06-26 IMAGING — CT CT ABD-PELV W/ CM
2 of 5 series · 16 of 46 positions shown, 18 images · IV contrast (APPLIED)
Comparison: 12/24/2017

CLINICAL DATA: Diarrhea. Cholecystectomy. Diverticulitis suspected.

EXAM:
CT ABDOMEN AND PELVIS WITH CONTRAST
TECHNIQUE: Multidetector CT imaging of the abdomen and pelvis was performed
using the standard protocol following bolus administration of
intravenous contrast.
CONTRAST:  75mL OMNIPAQUE IOHEXOL 300 MG/ML  SOLN

[Series 2: routine abd/pel with · axial · 0.84mm/px · z∈[-491,-66]mm · 13 of 95 slices shown, 15 images]
[im 5/95  soft-tissue]
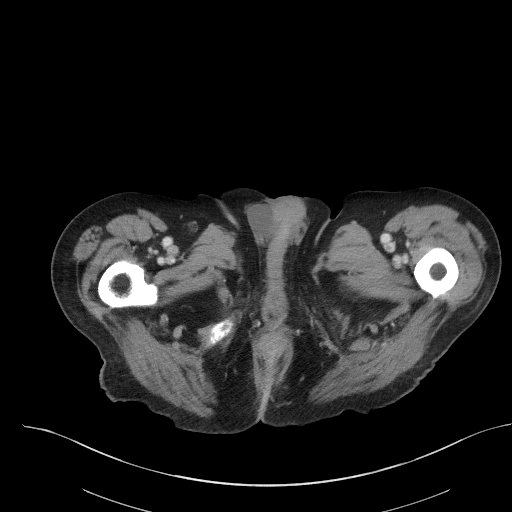
[im 5/95  bone]
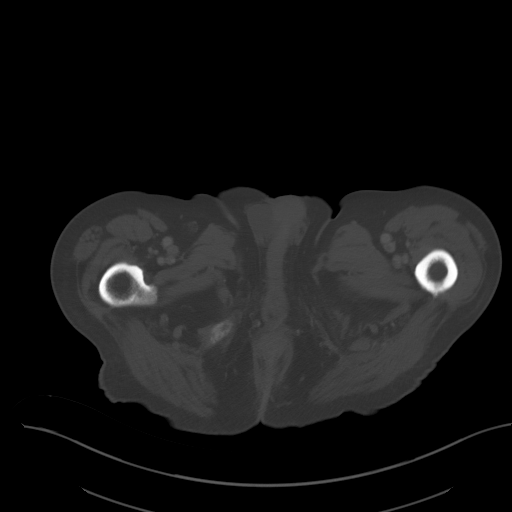
[im 15/95  soft-tissue]
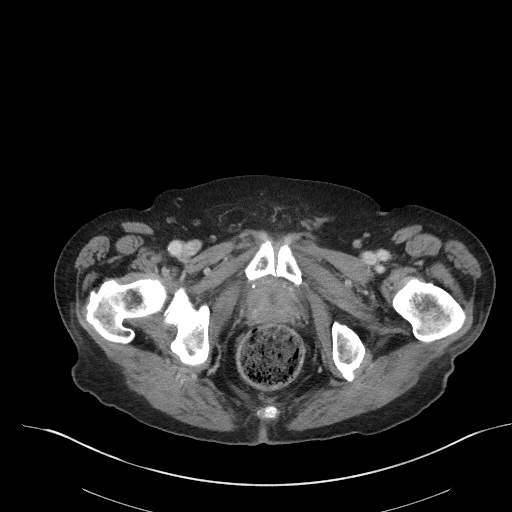
[im 20/95  soft-tissue]
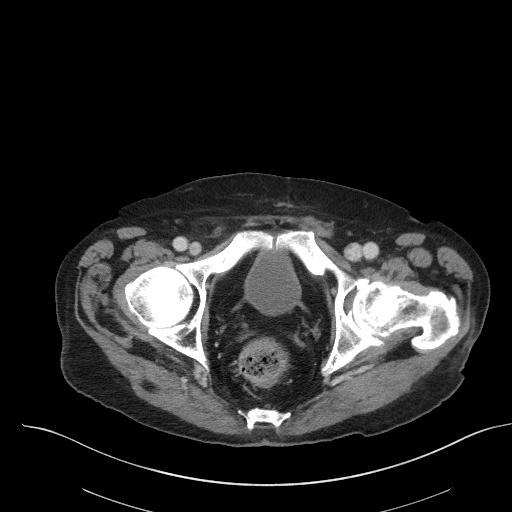
[im 25/95  soft-tissue]
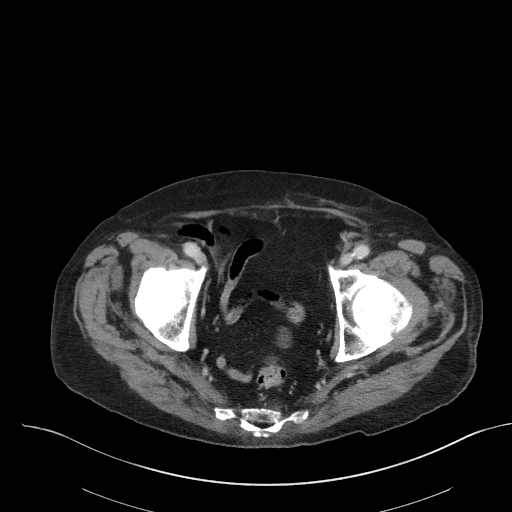
[im 35/95  soft-tissue]
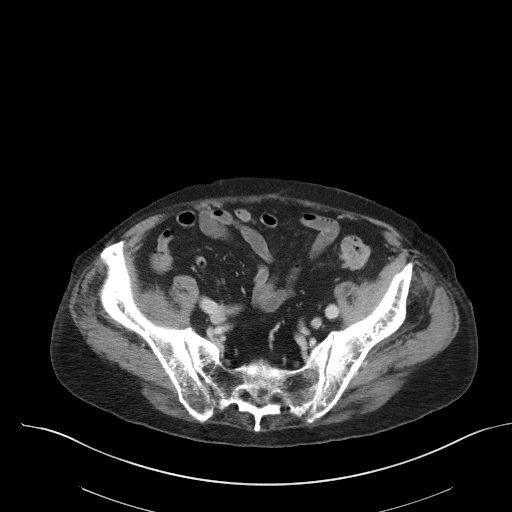
[im 40/95  soft-tissue]
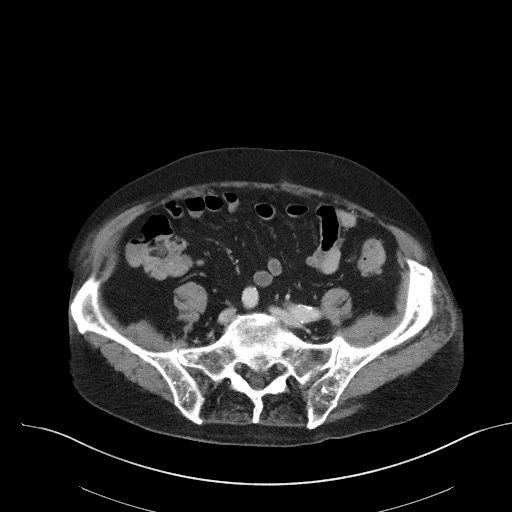
[im 50/95  soft-tissue]
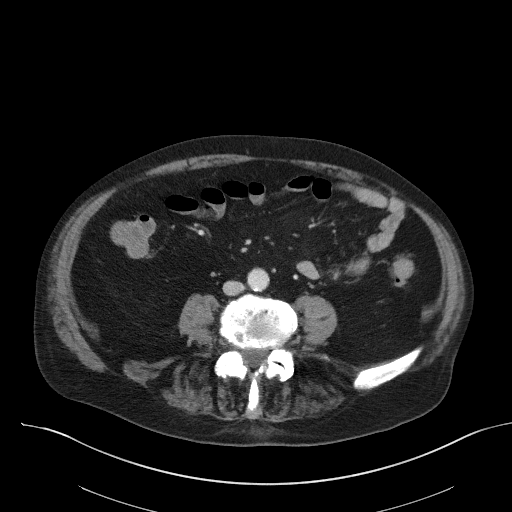
[im 55/95  soft-tissue]
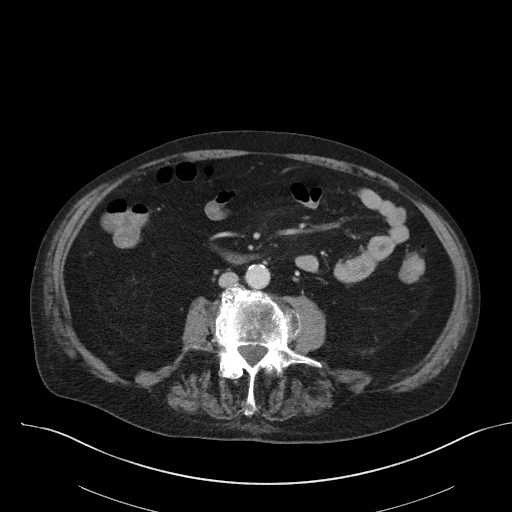
[im 60/95  soft-tissue]
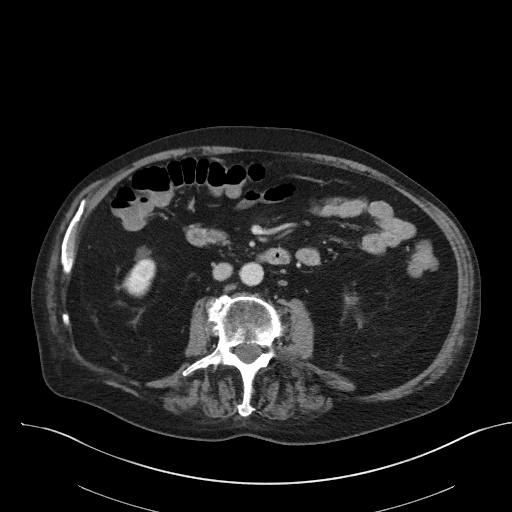
[im 60/95  bone]
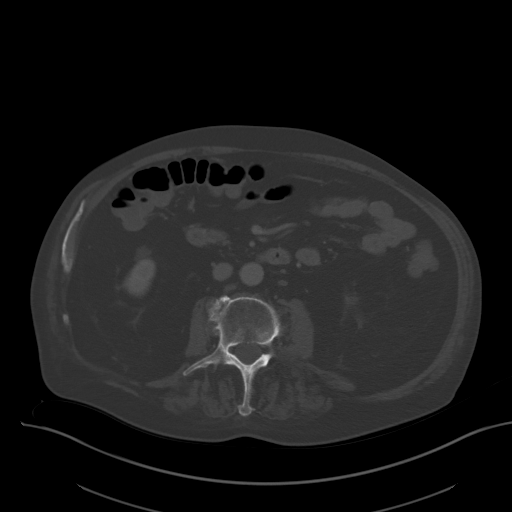
[im 70/95  soft-tissue]
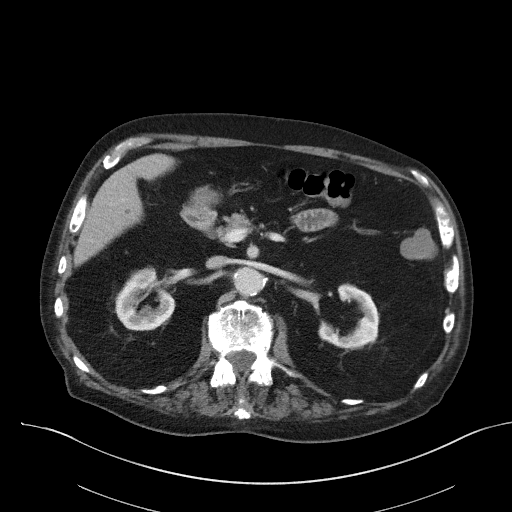
[im 75/95  soft-tissue]
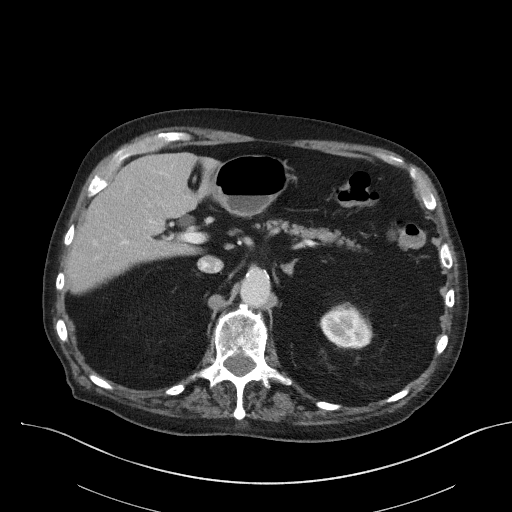
[im 80/95  soft-tissue]
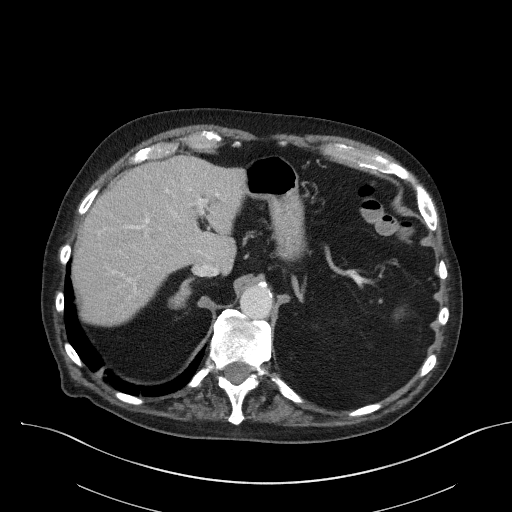
[im 90/95  soft-tissue]
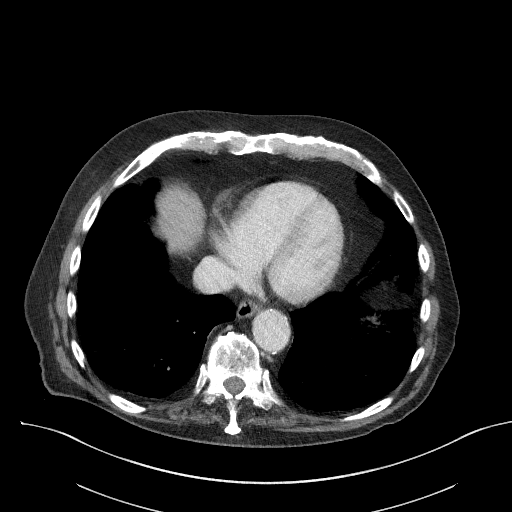

[Series 5: coronal st · coronal · 0.76mm/px · 3 of 84 slices shown]
[im 28/84  soft-tissue]
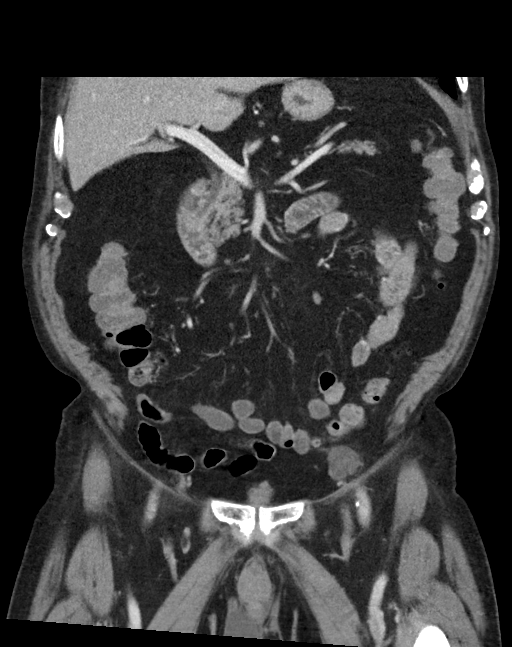
[im 37/84  soft-tissue]
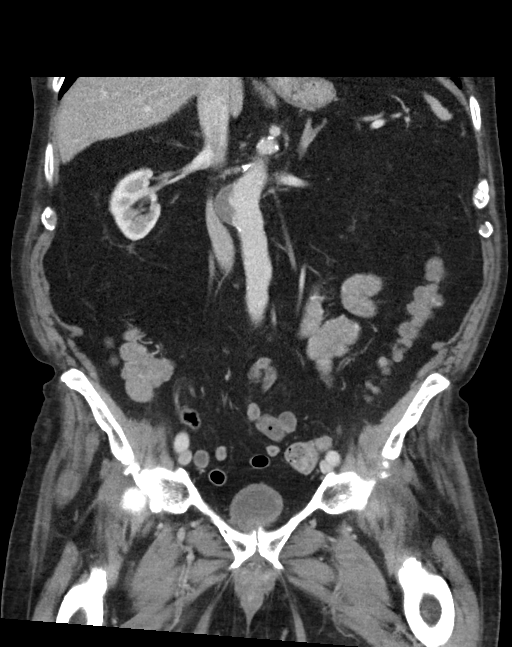
[im 47/84  soft-tissue]
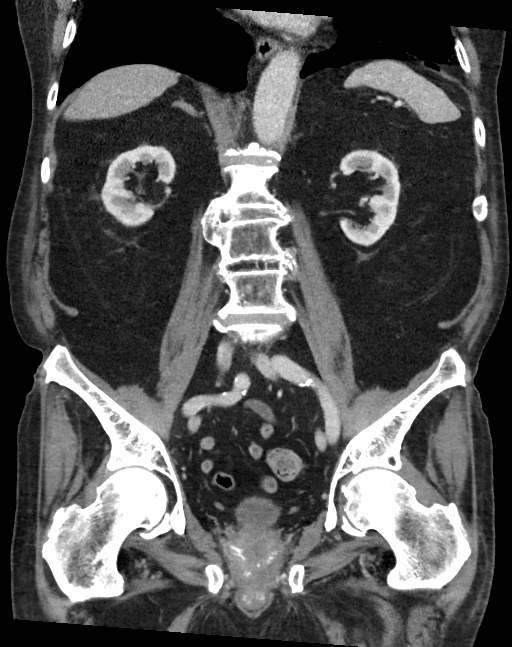

[16 of 46 positions shown; findings below may reference images not displayed]

FINDINGS: Lower chest: Bibasilar scarring. Normal heart size without
pericardial or pleural effusion. Tiny hiatal hernia.

Hepatobiliary: Low-density right hepatic lobe lesions are too small
to characterize but likely cysts. Cholecystectomy, without biliary
ductal dilatation.

Pancreas: Normal pancreas for age, Without duct dilatation or acute
inflammation.

Spleen: Normal in size, without focal abnormality.

Adrenals/Urinary Tract: Right greater than left adrenal thickening.
Mild renal cortical thinning bilaterally. Normal urinary bladder.

Stomach/Bowel: Normal remainder of the stomach. Transverse duodenal
diverticulum. Moderate amount of stool within the rectum, including
at 5.4 cm. Scattered colonic diverticula. Normal terminal ileum and
appendix. Otherwise normal small bowel.

Vascular/Lymphatic: Aortic and branch vessel atherosclerosis.
Infrarenal abdominal aortic focal dilatation including at up to
cm on [DATE]. Extensive wall thrombus at this level, eccentric right.
Measures 3.0 cm on 12/23/2017. No abdominopelvic adenopathy.

Reproductive: Mild prostatomegaly with probable TURP defect.

Other: No significant free fluid. Presumed left inguinal hernia
repair, with soft tissue plug on 68/2. Small fat containing right
inguinal hernia.

Musculoskeletal: Degenerate disc disease at the lumbosacral
junction.
IMPRESSION: 1. Moderate stool in the rectum, suggesting fecal impaction.
2. No evidence of colitis or other explanation for diarrhea.
3. Mild increase in focal infrarenal abdominal aortic ectasia with
extensive wall thrombus within.
4.  Tiny hiatal hernia.
5.  Aortic Atherosclerosis ([id number]).

## 2021-07-03 IMAGING — DX DG ABDOMEN 1V
2 series · 2 of 2 positions shown · non-contrast
Comparison: None.

CLINICAL DATA: Constipation

EXAM:
ABDOMEN - 1 VIEW

[abdomen supine (1 of 2)]
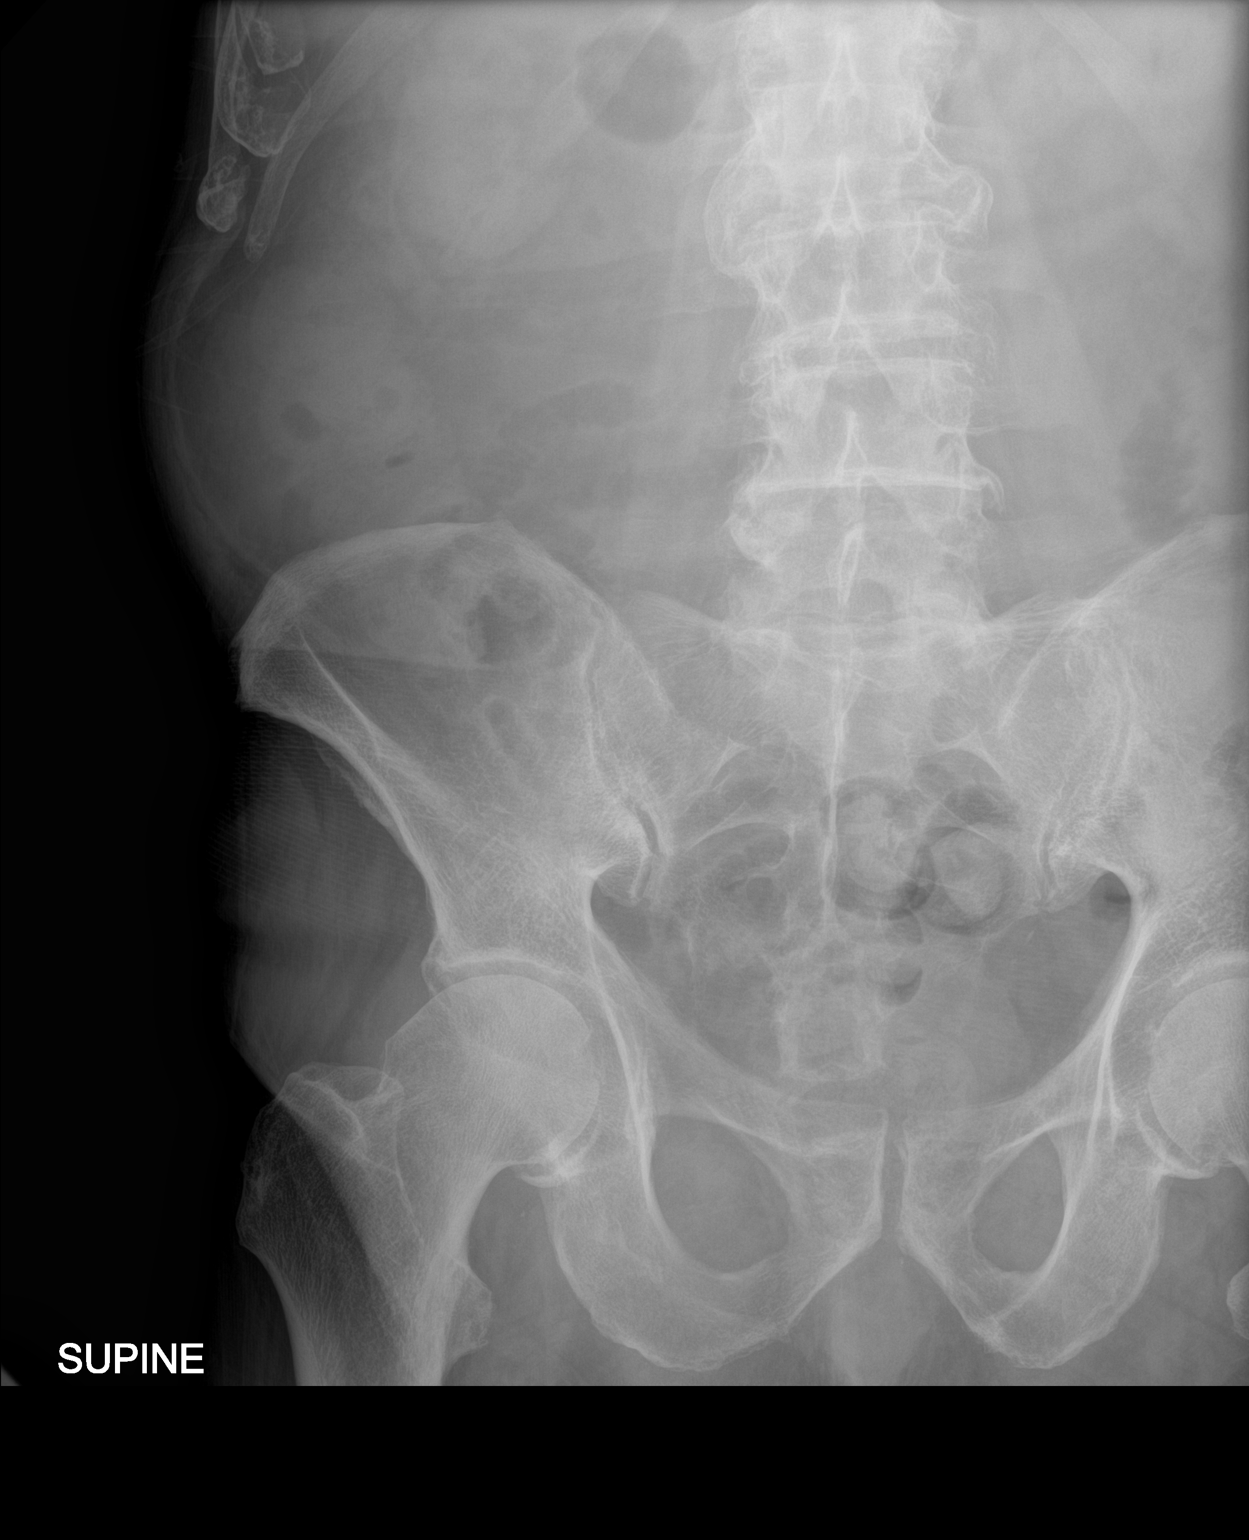

[abdomen supine (2 of 2)]
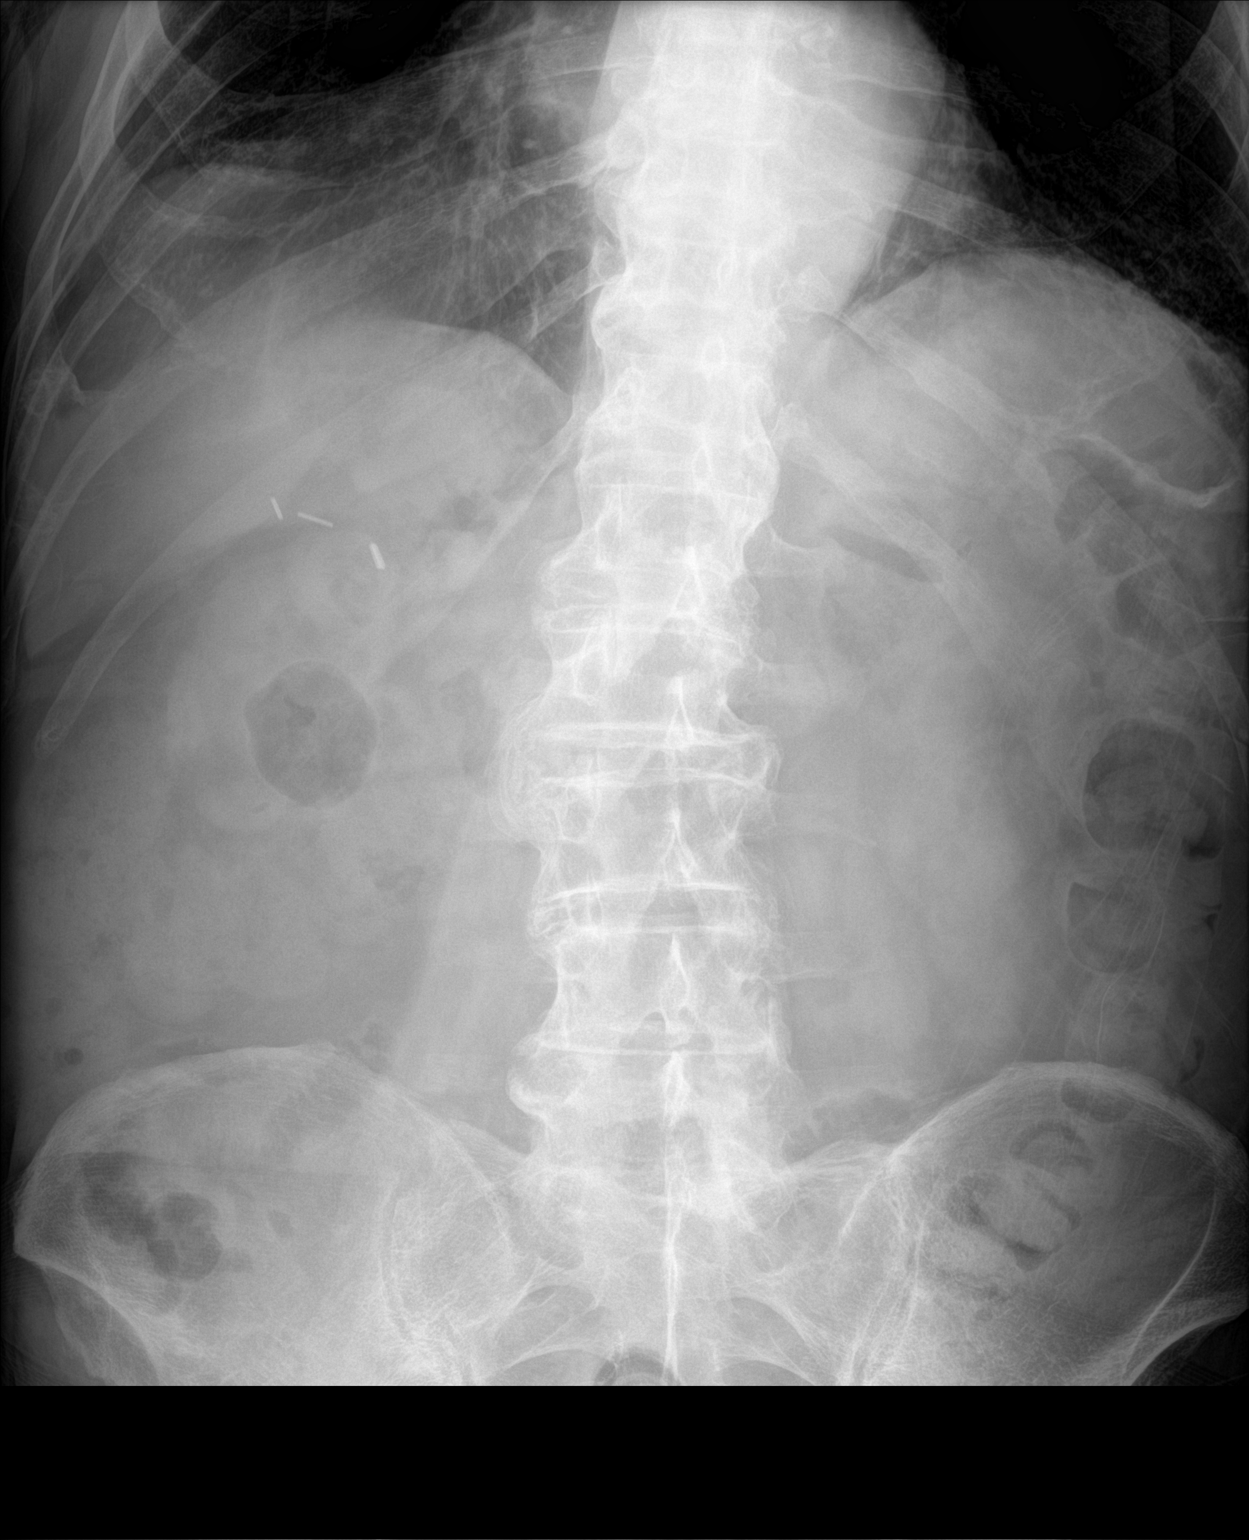

[2 of 2 positions shown; findings below may reference images not displayed]

FINDINGS: Bowel gas pattern is unremarkable. Stool burden is mild.
Cholecystectomy clips are present. Degenerative changes of the
lumbar spine.
IMPRESSION: Unremarkable bowel gas pattern.  Mild stool burden.

## 2021-08-05 ENCOUNTER — Other Ambulatory Visit: Payer: Self-pay | Admitting: Cardiovascular Disease

## 2021-09-21 DEATH — deceased
